# Patient Record
Sex: Female | Born: 1990 | Race: Black or African American | Hispanic: No | Marital: Single | Smoking: Never smoker
Health system: Southern US, Community
[De-identification: ages and names within clinical notes are randomized; demographics above are authoritative.]

## PROBLEM LIST (undated history)

## (undated) ENCOUNTER — Inpatient Hospital Stay (HOSPITAL_COMMUNITY): Payer: Self-pay

## (undated) DIAGNOSIS — N61 Mastitis without abscess: Secondary | ICD-10-CM

## (undated) HISTORY — PX: HERNIA REPAIR: SHX51

---
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
## 2013-10-10 NOTE — L&D Delivery Note (Signed)
Delivery Note At 8:53 PM a viable female was delivered via Vaginal, Spontaneous Delivery (Presentation:  OA ;  ).  APGAR: 8, 9; weight:  2750 grams .   Placenta status: Intact, Spontaneous.  Cord: 3 vessels with the following complications: Short.  Cord pH: none  Anesthesia: Epidural  Episiotomy: None Lacerations: None Suture Repair: None Est. Blood Loss (mL): 350  Mom to postpartum.  Baby to Couplet care / Skin to Skin.  [name] 03/15/2014, 9:48 PM

## 2013-11-28 ENCOUNTER — Ambulatory Visit (INDEPENDENT_AMBULATORY_CARE_PROVIDER_SITE_OTHER): Admitting: Obstetrics & Gynecology

## 2013-11-28 ENCOUNTER — Encounter: Payer: Self-pay | Admitting: Obstetrics & Gynecology

## 2013-11-28 ENCOUNTER — Other Ambulatory Visit: Payer: Self-pay | Admitting: *Deleted

## 2013-11-28 VITALS — BP 126/80 | Temp 97.9°F | Ht 60.0 in | Wt 122.0 lb

## 2013-11-28 DIAGNOSIS — Z3201 Encounter for pregnancy test, result positive: Secondary | ICD-10-CM

## 2013-11-28 DIAGNOSIS — Z3400 Encounter for supervision of normal first pregnancy, unspecified trimester: Secondary | ICD-10-CM | POA: Insufficient documentation

## 2013-11-28 NOTE — Progress Notes (Signed)
Pulse -91  Subjective:    Carol Williamson is being seen today for her first obstetrical visit.  This is not a planned pregnancy. She is at 4919w0d gestation. Her obstetrical history is significant for late to prenatal care. Relationship with FOB: not together and not involved. Patient does not intend to breast feed. Pregnancy history fully reviewed.  Menstrual History: OB History   Grav Para Term Preterm Abortions TAB SAB Ect Mult Living   1               Last Pap: 04/2013 WNL Menarche age: [age]  Patient's last menstrual period was 06/06/2013.    The following portions of the patient's history were reviewed and updated as appropriate: allergies, current medications, past family history, past medical history, past social history, past surgical history and problem list.  Review of Systems Pertinent items are noted in HPI.    Objective:      General Appearance:    Alert, cooperative, no distress, appears stated age  Head:    Normocephalic, without obvious abnormality, atraumatic  Eyes:    PERRL, conjunctiva/corneas clear, EOM's intact, fundi    benign, both eyes  Ears:    Normal TM's and external ear canals, both ears  Nose:   Nares normal, septum midline, mucosa normal, no drainage    or sinus tenderness  Throat:   Lips, mucosa, and tongue normal; teeth and gums normal  Neck:   Supple, symmetrical, trachea midline, no adenopathy;    thyroid:  no enlargement/tenderness/nodules; no carotid   bruit or JVD  Back:     Symmetric, no curvature, ROM normal, no CVA tenderness  Lungs:     Clear to auscultation bilaterally, respirations unlabored  Chest Wall:    No tenderness or deformity   Heart:    Regular rate and rhythm, S1 and S2 normal, no murmur, rub   or gallop  Breast Exam:    No tenderness, masses, or nipple abnormality  Abdomen:     Soft, non-tender, bowel sounds active all four quadrants,    no masses, no organomegaly  Genitalia:    Normal female without lesion, discharge or  tenderness  Extremities:   Extremities normal, atraumatic, no cyanosis or edema  Pulses:   2+ and symmetric all extremities  Skin:   Skin color, texture, turgor normal, no rashes or lesions  Lymph nodes:   Cervical, supraclavicular, and axillary nodes normal  Neurologic:   CNII-XII intact, normal strength, sensation and reflexes    throughout        Assessment:    Pregnancy at 5619w0d weeks    Plan:    Initial labs drawn. Prenatal vitamins.  Counseling provided regarding continued use of seat belts, cessation of alcohol consumption, smoking or use of illicit drugs; infection precautions i.e., influenza/TDAP immunizations, toxoplasmosis,CMV, parvovirus, listeria and varicella; workplace safety, exercise during pregnancy; routine dental care, safe medications, sexual activity, hot tubs, saunas, pools, travel, caffeine use, fish and methlymercury, potential toxins, hair treatments, varicose veins Weight gain recommendations per IOM guidelines reviewed: normal weight/BMI 18.5 - 24.9--> gain 25 - 35 lbs Problem list reviewed and updated. Role of ultrasound in pregnancy discussed; fetal survey: ordered. Amniocentesis discussed: not indicated. Follow up in 4 weeks. 50% of 20 min visit spent on counseling and coordination of care.

## 2013-11-29 LAB — OBSTETRIC PANEL
Antibody Screen: NEGATIVE
Basophils Absolute: 0 10*3/uL (ref 0.0–0.1)
Basophils Relative: 0 % (ref 0–1)
Eosinophils Absolute: 0.1 10*3/uL (ref 0.0–0.7)
Eosinophils Relative: 1 % (ref 0–5)
HCT: 31.7 % — ABNORMAL LOW (ref 36.0–46.0)
HEP B S AG: NEGATIVE
Hemoglobin: 10.8 g/dL — ABNORMAL LOW (ref 12.0–15.0)
LYMPHS PCT: 18 % (ref 12–46)
Lymphs Abs: 1.8 10*3/uL (ref 0.7–4.0)
MCH: 30 pg (ref 26.0–34.0)
MCHC: 34.1 g/dL (ref 30.0–36.0)
MCV: 88.1 fL (ref 78.0–100.0)
MONOS PCT: 5 % (ref 3–12)
Monocytes Absolute: 0.5 10*3/uL (ref 0.1–1.0)
NEUTROS ABS: 7.7 10*3/uL (ref 1.7–7.7)
Neutrophils Relative %: 76 % (ref 43–77)
Platelets: 320 10*3/uL (ref 150–400)
RBC: 3.6 MIL/uL — AB (ref 3.87–5.11)
RDW: 13.1 % (ref 11.5–15.5)
RH TYPE: POSITIVE
RUBELLA: 8.41 {index} — AB (ref <0.90)
WBC: 10.1 10*3/uL (ref 4.0–10.5)

## 2013-11-29 LAB — HEMOGLOBINOPATHY EVALUATION
HEMOGLOBIN OTHER: 0 %
Hgb A2 Quant: 2.8 % (ref 2.2–3.2)
Hgb A: 97.2 % (ref 96.8–97.8)
Hgb F Quant: 0 % (ref 0.0–2.0)
Hgb S Quant: 0 %

## 2013-11-29 LAB — GC/CHLAMYDIA PROBE AMP
CT Probe RNA: NEGATIVE
GC Probe RNA: NEGATIVE

## 2013-11-29 LAB — VARICELLA ZOSTER ANTIBODY, IGG: VARICELLA IGG: 2798 {index} — AB (ref <135.00)

## 2013-11-29 LAB — WET PREP BY MOLECULAR PROBE
Candida species: POSITIVE — AB
Gardnerella vaginalis: POSITIVE — AB
Trichomonas vaginosis: NEGATIVE

## 2013-11-29 LAB — HIV ANTIBODY (ROUTINE TESTING W REFLEX): HIV: NONREACTIVE

## 2013-11-29 LAB — VITAMIN D 25 HYDROXY (VIT D DEFICIENCY, FRACTURES): Vit D, 25-Hydroxy: 40 ng/mL (ref 30–89)

## 2013-11-29 NOTE — Patient Instructions (Signed)
Second Trimester of Pregnancy The second trimester is from week 13 through week 28, months 4 through 6. The second trimester is often a time when you feel your best. Your body has also adjusted to being pregnant, and you begin to feel better physically. Usually, morning sickness has lessened or quit completely, you may have more energy, and you may have an increase in appetite. The second trimester is also a time when the fetus is growing rapidly. At the end of the sixth month, the fetus is about 9 inches long and weighs about 1 pounds. You will likely begin to feel the baby move (quickening) between 18 and 20 weeks of the pregnancy. BODY CHANGES Your body goes through many changes during pregnancy. The changes vary from woman to woman.   Your weight will continue to increase. You will notice your lower abdomen bulging out.  You may begin to get stretch marks on your hips, abdomen, and breasts.  You may develop headaches that can be relieved by medicines approved by your caregiver.  You may urinate more often because the fetus is pressing on your bladder.  You may develop or continue to have heartburn as a result of your pregnancy.  You may develop constipation because certain hormones are causing the muscles that push waste through your intestines to slow down.  You may develop hemorrhoids or swollen, bulging veins (varicose veins).  You may have back pain because of the weight gain and pregnancy hormones relaxing your joints between the bones in your pelvis and as a result of a shift in weight and the muscles that support your balance.  Your breasts will continue to grow and be tender.  Your gums may bleed and may be sensitive to brushing and flossing.  Dark spots or blotches (chloasma, mask of pregnancy) may develop on your face. This will likely fade after the baby is born.  A dark line from your belly button to the pubic area (linea nigra) may appear. This will likely fade after the  baby is born. WHAT TO EXPECT AT YOUR PRENATAL VISITS During a routine prenatal visit:  You will be weighed to make sure you and the fetus are growing normally.  Your blood pressure will be taken.  Your abdomen will be measured to track your baby's growth.  The fetal heartbeat will be listened to.  Any test results from the previous visit will be discussed. Your caregiver may ask you:  How you are feeling.  If you are feeling the baby move.  If you have had any abnormal symptoms, such as leaking fluid, bleeding, severe headaches, or abdominal cramping.  If you have any questions. Other tests that may be performed during your second trimester include:  Blood tests that check for:  Low iron levels (anemia).  Gestational diabetes (between 24 and 28 weeks).  Rh antibodies.  Urine tests to check for infections, diabetes, or protein in the urine.  An ultrasound to confirm the proper growth and development of the baby.  An amniocentesis to check for possible genetic problems.  Fetal screens for spina bifida and Down syndrome. HOME CARE INSTRUCTIONS   Avoid all smoking, herbs, alcohol, and unprescribed drugs. These chemicals affect the formation and growth of the baby.  Follow your caregiver's instructions regarding medicine use. There are medicines that are either safe or unsafe to take during pregnancy.  Exercise only as directed by your caregiver. Experiencing uterine cramps is a good sign to stop exercising.  Continue to eat regular,   healthy meals.  Wear a good support bra for breast tenderness.  Do not use hot tubs, steam rooms, or saunas.  Wear your seat belt at all times when driving.  Avoid raw meat, uncooked cheese, cat litter boxes, and soil used by cats. These carry germs that can cause birth defects in the baby.  Take your prenatal vitamins.  Try taking a stool softener (if your caregiver approves) if you develop constipation. Eat more high-fiber foods,  such as fresh vegetables or fruit and whole grains. Drink plenty of fluids to keep your urine clear or pale yellow.  Take warm sitz baths to soothe any pain or discomfort caused by hemorrhoids. Use hemorrhoid cream if your caregiver approves.  If you develop varicose veins, wear support hose. Elevate your feet for 15 minutes, 3 4 times a day. Limit salt in your diet.  Avoid heavy lifting, wear low heel shoes, and practice good posture.  Rest with your legs elevated if you have leg cramps or low back pain.  Visit your dentist if you have not gone yet during your pregnancy. Use a soft toothbrush to brush your teeth and be gentle when you floss.  A sexual relationship may be continued unless your caregiver directs you otherwise.  Continue to go to all your prenatal visits as directed by your caregiver. SEEK MEDICAL CARE IF:   You have dizziness.  You have mild pelvic cramps, pelvic pressure, or nagging pain in the abdominal area.  You have persistent nausea, vomiting, or diarrhea.  You have a bad smelling vaginal discharge.  You have pain with urination. SEEK IMMEDIATE MEDICAL CARE IF:   You have a fever.  You are leaking fluid from your vagina.  You have spotting or bleeding from your vagina.  You have severe abdominal cramping or pain.  You have rapid weight gain or loss.  You have shortness of breath with chest pain.  You notice sudden or extreme swelling of your face, hands, ankles, feet, or legs.  You have not felt your baby move in over an hour.  You have severe headaches that do not go away with medicine.  You have vision changes. Document Released: 09/20/2001 Document Revised: 05/29/2013 Document Reviewed: 11/27/2012 [employer] Patient Information 2014 [employer].  

## 2013-11-30 LAB — CULTURE, OB URINE
COLONY COUNT: NO GROWTH
Organism ID, Bacteria: NO GROWTH

## 2013-12-02 ENCOUNTER — Ambulatory Visit (HOSPITAL_COMMUNITY)

## 2013-12-02 ENCOUNTER — Ambulatory Visit (HOSPITAL_COMMUNITY)
Admission: RE | Admit: 2013-12-02 | Discharge: 2013-12-02 | Disposition: A | Discharge status: Home / Self Care | Source: Ambulatory Visit | Attending: Obstetrics & Gynecology | Admitting: Obstetrics & Gynecology

## 2013-12-02 DIAGNOSIS — Z3400 Encounter for supervision of normal first pregnancy, unspecified trimester: Secondary | ICD-10-CM

## 2013-12-02 DIAGNOSIS — O0930 Supervision of pregnancy with insufficient antenatal care, unspecified trimester: Secondary | ICD-10-CM | POA: Insufficient documentation

## 2013-12-02 DIAGNOSIS — Z3689 Encounter for other specified antenatal screening: Secondary | ICD-10-CM | POA: Insufficient documentation

## 2013-12-02 IMAGING — US US OB COMP +14 WK
2 series · 12 of 28 positions shown · non-contrast
Comparison: none

[Series 1: us ob comp +14 wk · 11 of 78 slices shown (1 of 2)]
[im 4/78]
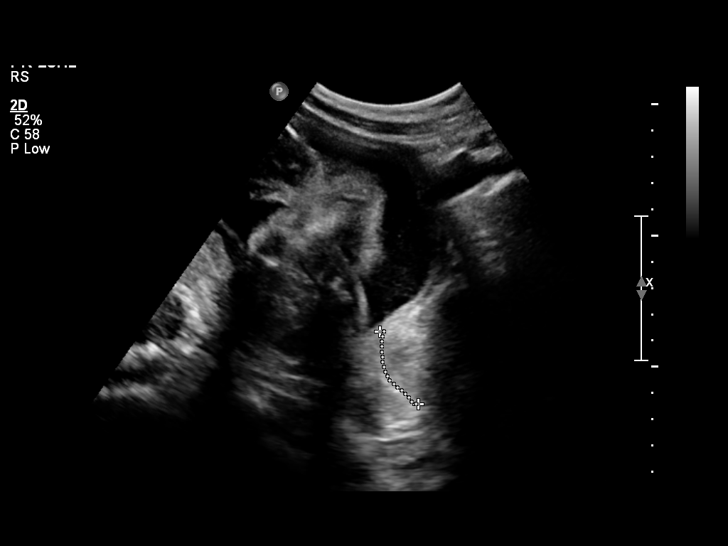
[im 10/78]
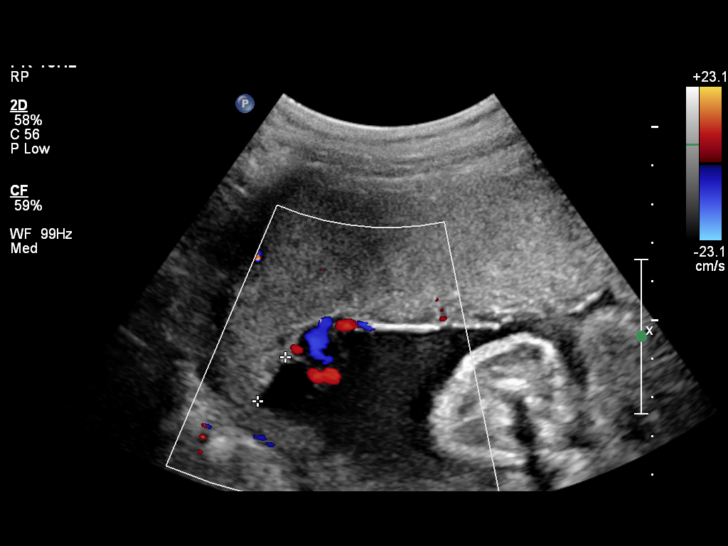
[im 16/78]
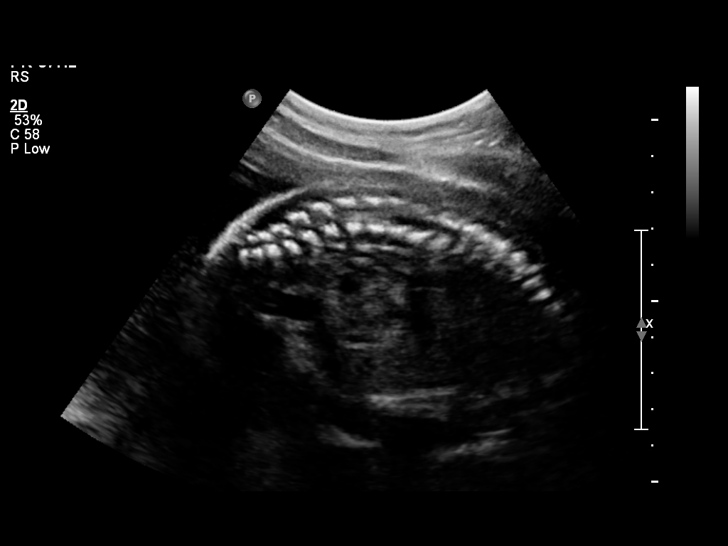
[im 25/78]
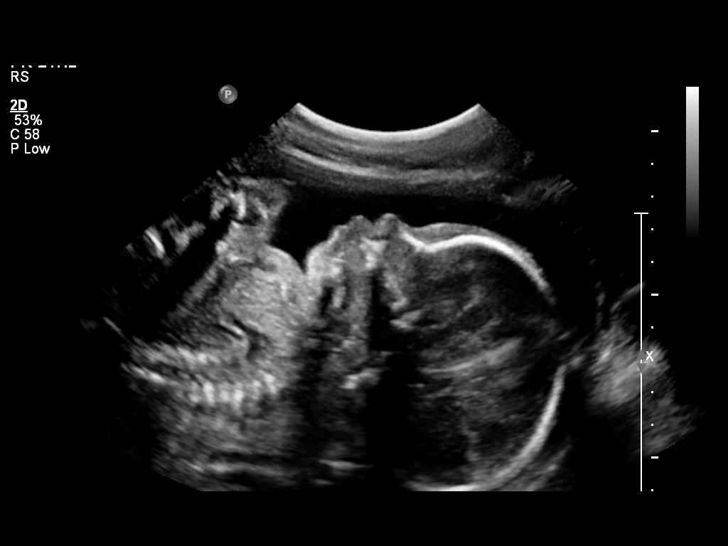
[im 31/78]
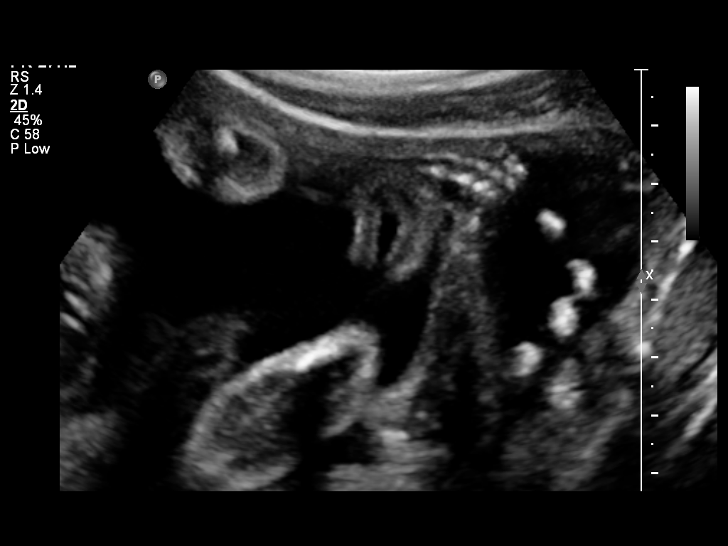
[im 37/78]
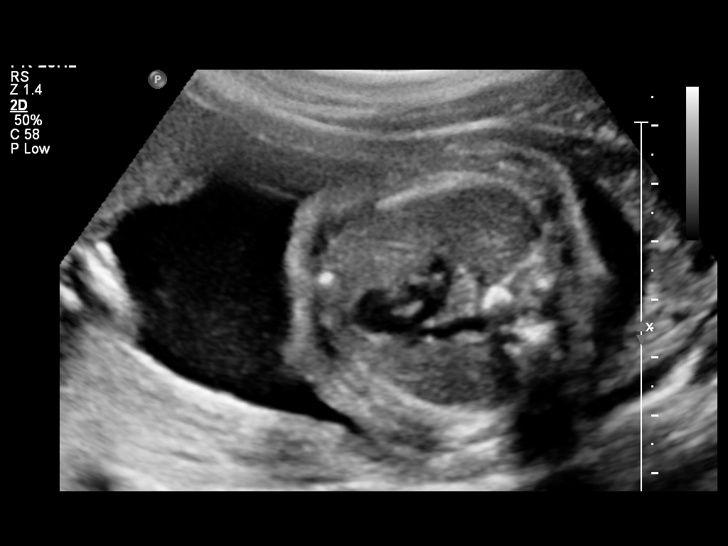
[im 47/78]
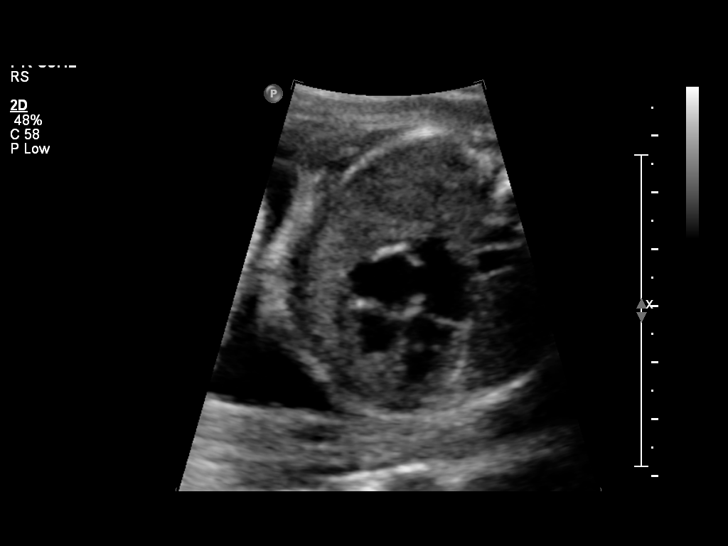
[im 53/78]
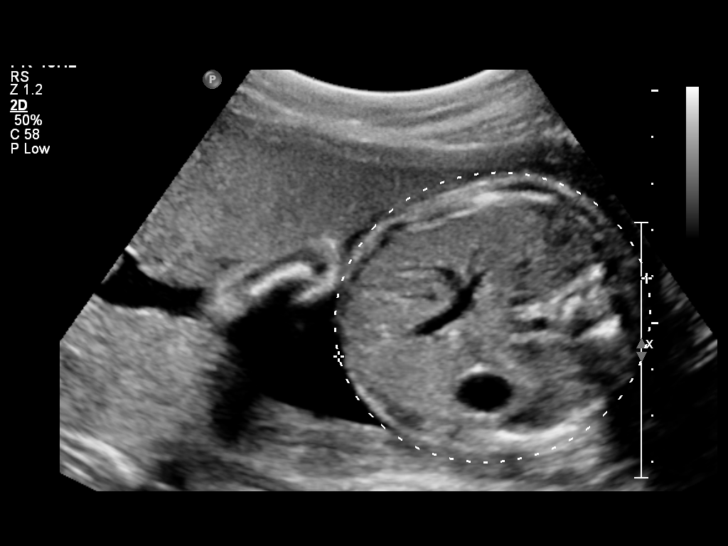
[im 59/78]
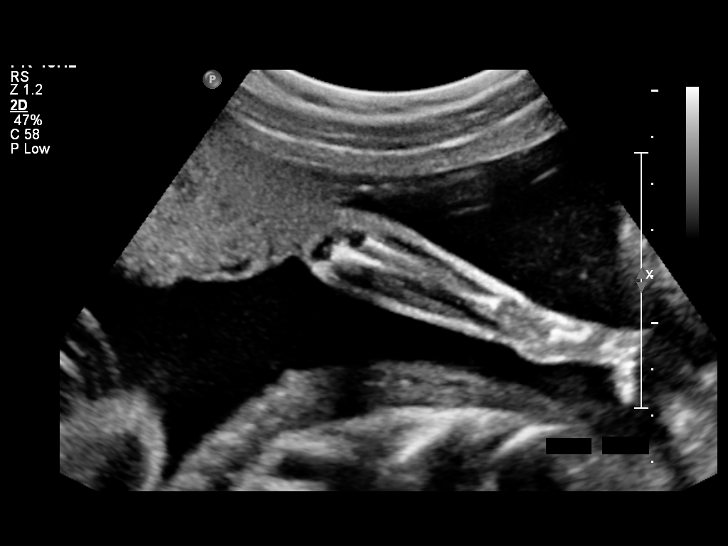
[im 68/78]
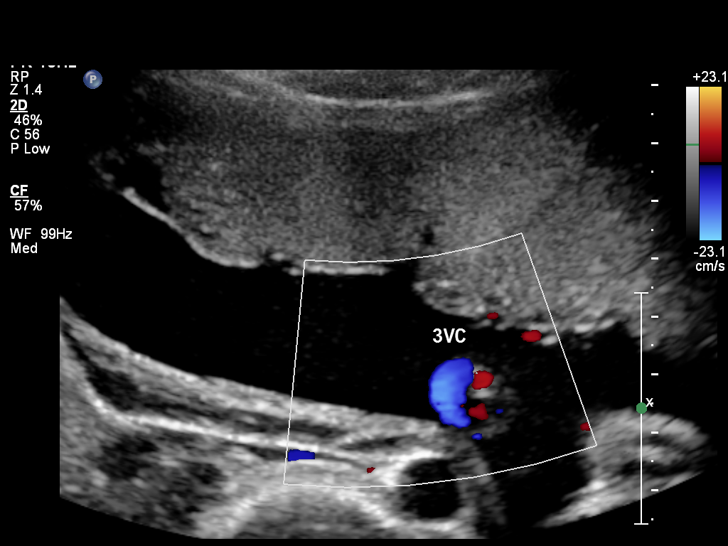
[im 74/78]
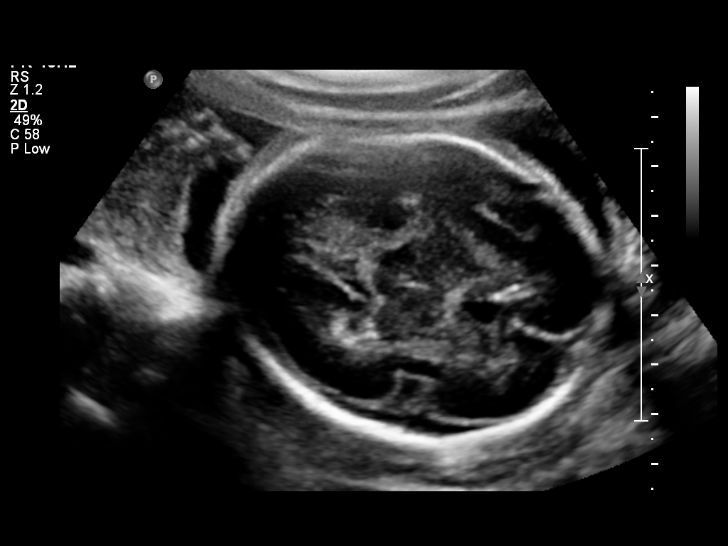

[Series 1: us ob comp +14 wk · 1 of 6 slices shown (2 of 2)]
[im 1/6]
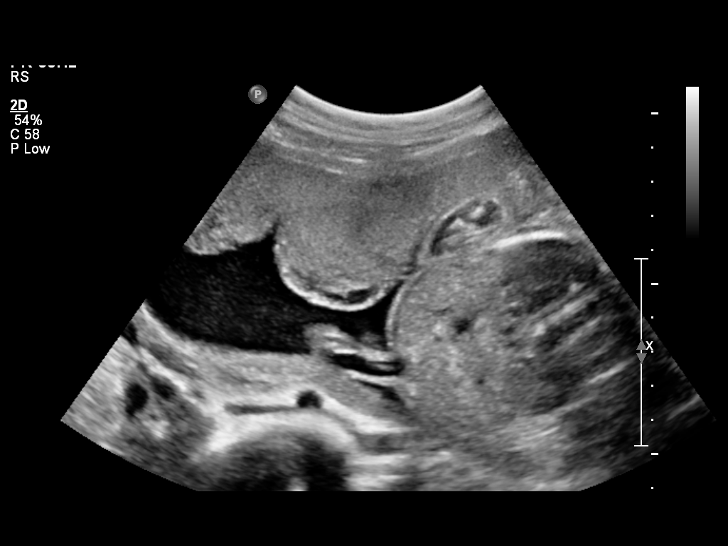

[12 of 28 positions shown; findings below may reference images not displayed]

OBSTETRICS REPORT
                      (Signed Final [DATE] [DATE])

Service(s) Provided

 US OB COMP + 14 WK                                    76805.1
Indications

 No or Little Prenatal Care                            [AF]
 Uncertain LMP;  Establish Gestational [AGE]
Fetal Evaluation

 Num Of Fetuses:    1
 Fetal Heart Rate:  158                          bpm
 Cardiac Activity:  Observed
 Presentation:      Cephalic
 Placenta:          Anterior, above cervical os
 P. Cord            Visualized, central
 Insertion:

 Amniotic Fluid
 AFI FV:      Subjectively within normal limits
                                             Larg Pckt:       5  cm
Biometry

 BPD:     61.6  mm     G. Age:  25w 0d                CI:        72.13   70 - 86
                                                      FL/HC:      19.3   18.7 -

 HC:     230.8  mm     G. Age:  25w 1d       33  %    HC/AC:      1.12   1.04 -

 AC:     205.8  mm     G. Age:  25w 1d       46  %    FL/BPD:     72.2   71 - 87
 FL:      44.5  mm     G. Age:  24w 5d       26  %    FL/AC:      21.6   20 - 24
 HUM:     41.6  mm     G. Age:  25w 1d       46  %
 Est. FW:     755  gm    1 lb 11 oz      53  %
Gestational Age

 LMP:           25w 4d        Date:  [DATE]                 EDD:   [DATE]
 U/S Today:     25w 0d                                        EDD:   [DATE]
 Best:          25w 0d     Det. By:  U/S ([DATE])           EDD:   [DATE]
Anatomy

 Cranium:          Appears normal         Aortic Arch:      Appears normal
 Fetal Cavum:      Appears normal         Ductal Arch:      Appears normal
 Ventricles:       Appears normal         Diaphragm:        Appears normal
 Choroid Plexus:   Appears normal         Stomach:          Appears normal, left
                                                            sided
 Cerebellum:       Appears normal         Abdomen:          Appears normal
 Posterior Fossa:  Appears normal         Abdominal Wall:   Appears nml (cord
                                                            insert, abd wall)
 Nuchal Fold:      Not applicable (>20    Cord Vessels:     Appears normal (3
                   wks GA)                                  vessel cord)
 Face:             Appears normal         Kidneys:          Appear normal
                   (orbits and profile)
 Lips:             Appears normal         Bladder:          Appears normal
 Heart:            Appears normal         Spine:            Appears normal
                   (4CH, axis, and
                   situs)
 RVOT:             Appears normal         Lower             Appears normal
                                          Extremities:
 LVOT:             Appears normal         Upper             Appears normal
                                          Extremities:

 Other:  Female gender. Heels and  5th digit visualized.
Cervix Uterus Adnexa

 Cervical Length:    3.5      cm

 Cervix:       Normal appearance by transabdominal scan.
 Uterus:       No abnormality visualized.
 Left Ovary:    No adnexal mass visualized.
 Right Ovary:   No adnexal mass visualized.

 Adnexa:     No abnormality visualized.
Impression

 IUP at 25+0 weeks
 Normal detailed fetal anatomy
 Normal amniotic fluid volume
 EDC based on today's measurements; EDC [DATE]
Recommendations

 Follow-up as clinically indicated

 questions or concerns.

## 2013-12-06 ENCOUNTER — Other Ambulatory Visit: Payer: Self-pay | Admitting: *Deleted

## 2013-12-06 DIAGNOSIS — N760 Acute vaginitis: Secondary | ICD-10-CM

## 2013-12-06 DIAGNOSIS — B9689 Other specified bacterial agents as the cause of diseases classified elsewhere: Secondary | ICD-10-CM

## 2013-12-06 DIAGNOSIS — B379 Candidiasis, unspecified: Secondary | ICD-10-CM

## 2013-12-06 MED ORDER — TERCONAZOLE 0.4 % VA CREA
1.0000 | TOPICAL_CREAM | Freq: Every day | VAGINAL | Status: DC
Start: 2013-12-06 — End: 2013-12-30

## 2013-12-06 MED ORDER — METRONIDAZOLE 500 MG PO TABS: 500.0000 mg | ORAL_TABLET | Freq: Two times a day (BID) | ORAL | Status: DC

## 2013-12-24 ENCOUNTER — Other Ambulatory Visit

## 2013-12-24 DIAGNOSIS — Z3400 Encounter for supervision of normal first pregnancy, unspecified trimester: Secondary | ICD-10-CM

## 2013-12-24 LAB — CBC
HCT: 32.2 % — ABNORMAL LOW (ref 36.0–46.0)
Hemoglobin: 10.9 g/dL — ABNORMAL LOW (ref 12.0–15.0)
MCH: 29.9 pg (ref 26.0–34.0)
MCHC: 33.9 g/dL (ref 30.0–36.0)
MCV: 88.5 fL (ref 78.0–100.0)
PLATELETS: 263 10*3/uL (ref 150–400)
RBC: 3.64 MIL/uL — AB (ref 3.87–5.11)
RDW: 12.9 % (ref 11.5–15.5)
WBC: 12.3 10*3/uL — ABNORMAL HIGH (ref 4.0–10.5)

## 2013-12-24 LAB — OB RESULTS CONSOLE HIV ANTIBODY (ROUTINE TESTING): HIV: NONREACTIVE

## 2013-12-25 LAB — GLUCOSE TOLERANCE, 2 HOURS W/ 1HR
GLUCOSE, FASTING: 61 mg/dL — AB (ref 70–99)
Glucose, 1 hour: 78 mg/dL (ref 70–170)
Glucose, 2 hour: 77 mg/dL (ref 70–139)

## 2013-12-25 LAB — HIV ANTIBODY (ROUTINE TESTING W REFLEX): HIV: NONREACTIVE

## 2013-12-25 LAB — RPR

## 2013-12-26 ENCOUNTER — Other Ambulatory Visit

## 2013-12-30 ENCOUNTER — Ambulatory Visit (INDEPENDENT_AMBULATORY_CARE_PROVIDER_SITE_OTHER): Admitting: Obstetrics & Gynecology

## 2013-12-30 ENCOUNTER — Encounter: Admitting: Obstetrics & Gynecology

## 2013-12-30 VITALS — BP 111/68 | Temp 98.7°F | Wt 122.0 lb

## 2013-12-30 DIAGNOSIS — Z3400 Encounter for supervision of normal first pregnancy, unspecified trimester: Secondary | ICD-10-CM

## 2013-12-30 NOTE — Progress Notes (Signed)
Pulse: 90 Patient states she has some pain in the middle of her stomach sometimes. Patient states her knees have been giving out a lot.

## 2013-12-31 NOTE — Patient Instructions (Signed)

## 2014-01-15 ENCOUNTER — Encounter: Payer: Self-pay | Admitting: Obstetrics

## 2014-01-15 ENCOUNTER — Ambulatory Visit (INDEPENDENT_AMBULATORY_CARE_PROVIDER_SITE_OTHER): Admitting: Obstetrics

## 2014-01-15 VITALS — BP 108/74 | Temp 98.5°F | Wt 126.0 lb

## 2014-01-15 DIAGNOSIS — Z3400 Encounter for supervision of normal first pregnancy, unspecified trimester: Secondary | ICD-10-CM

## 2014-01-15 DIAGNOSIS — Z23 Encounter for immunization: Secondary | ICD-10-CM

## 2014-01-15 LAB — POCT URINALYSIS DIPSTICK
Bilirubin, UA: NEGATIVE
Glucose, UA: NEGATIVE
KETONES UA: NEGATIVE
Leukocytes, UA: NEGATIVE
Nitrite, UA: NEGATIVE
PROTEIN UA: NEGATIVE
RBC UA: NEGATIVE
SPEC GRAV UA: 1.01
Urobilinogen, UA: NEGATIVE
pH, UA: 7

## 2014-01-15 MED ORDER — DIPHTH-ACELL PERTUSSIS-TETANUS 6.7-46.8-5 LF-MCG/0.5 IM SUSP: 0.5000 mL | Freq: Once | INTRAMUSCULAR | Status: DC

## 2014-01-15 NOTE — Progress Notes (Signed)
Pulse: 91 Patient states she would like the Tdap.

## 2014-01-27 ENCOUNTER — Encounter: Payer: Self-pay | Admitting: Obstetrics

## 2014-01-30 ENCOUNTER — Ambulatory Visit (INDEPENDENT_AMBULATORY_CARE_PROVIDER_SITE_OTHER): Admitting: Obstetrics & Gynecology

## 2014-01-30 ENCOUNTER — Encounter: Admitting: Obstetrics

## 2014-01-30 VITALS — BP 126/80 | HR 89 | Temp 98.2°F | Wt 129.0 lb

## 2014-01-30 DIAGNOSIS — Z3400 Encounter for supervision of normal first pregnancy, unspecified trimester: Secondary | ICD-10-CM

## 2014-01-30 LAB — POCT URINALYSIS DIPSTICK
BILIRUBIN UA: NEGATIVE
Blood, UA: NEGATIVE
Glucose, UA: NEGATIVE
KETONES UA: NEGATIVE
Nitrite, UA: NEGATIVE
Protein, UA: NEGATIVE
Spec Grav, UA: <=1.005
Urobilinogen, UA: NEGATIVE
pH, UA: 8

## 2014-01-30 NOTE — Progress Notes (Signed)
Subjective:    Carol Williamson is a 23 y.o. female being seen today for her obstetrical visit. She is at 4147w6d gestation. Patient reports no complaints. Fetal movement: normal.   Past Medical History  Diagnosis Date  . Medical history non-contributory     Past Surgical History  Procedure Laterality Date  . Hernia repair      Current outpatient prescriptions:Pediatric Multiple Vit-C-FA (FLINSTONES GUMMIES OMEGA-3 DHA PO), Take by mouth., Disp: , Rfl:  Current facility-administered medications:DTaP (TRIPEDIA) injection 0.5 mL, 0.5 mL, Intramuscular, Once, Brock Badharles A Harper, MD No Known Allergies  History  Substance Use Topics  . Smoking status: Never Smoker   . Smokeless tobacco: Never Used  . Alcohol Use: No    Family History  Problem Relation Age of Onset  . Hypertension Father   . Diabetes Father      Review of Systems Constitutional: negative for anorexia Gastrointestinal: negative for abdominal pain Genitourinary:negative for vaginal discharge Musculoskeletal:negative for back pain Behavioral/Psych: negative for depression and tobacco use   Objective:    BP 126/80  Pulse 89  Temp(Src) 98.2 F (36.8 C)  Wt 58.514 kg (129 lb)  LMP 06/06/2013 FHT:  130 BPM  Uterine Size: size equals dates  Presentation: cephalic     Assessment:    Pregnancy @ 6947w6d weeks   Plan:     labs reviewed, problem list updated  Orders Placed This Encounter  Procedures  . POCT urinalysis dipstick    Follow up in 1 Week.

## 2014-02-12 ENCOUNTER — Encounter: Payer: Self-pay | Admitting: Obstetrics & Gynecology

## 2014-02-12 ENCOUNTER — Ambulatory Visit (INDEPENDENT_AMBULATORY_CARE_PROVIDER_SITE_OTHER): Admitting: Obstetrics & Gynecology

## 2014-02-12 VITALS — BP 136/89 | HR 84 | Temp 98.0°F | Wt 130.0 lb

## 2014-02-12 DIAGNOSIS — Z3400 Encounter for supervision of normal first pregnancy, unspecified trimester: Secondary | ICD-10-CM

## 2014-02-12 LAB — OB RESULTS CONSOLE GC/CHLAMYDIA
Chlamydia: NEGATIVE
Gonorrhea: NEGATIVE

## 2014-02-12 NOTE — Progress Notes (Signed)
Subjective:    Carol Williamson is a 23 y.o. female being seen today for her obstetrical visit. She is at 846w6d  Patient reports no complaints. Fetal movement: normal.   Past Medical History  Diagnosis Date  . Medical history non-contributory     Past Surgical History  Procedure Laterality Date  . Hernia repair      Current outpatient prescriptions:Pediatric Multiple Vit-C-FA (FLINSTONES GUMMIES OMEGA-3 DHA PO), Take by mouth., Disp: , Rfl:  Current facility-administered medications:DTaP (TRIPEDIA) injection 0.5 mL, 0.5 mL, Intramuscular, Once, Brock Badharles A Harper, MD No Known Allergies  History  Substance Use Topics  . Smoking status: Never Smoker   . Smokeless tobacco: Never Used  . Alcohol Use: No    Family History  Problem Relation Age of Onset  . Hypertension Father   . Diabetes Father      Review of Systems Constitutional: negative for anorexia Gastrointestinal: negative for abdominal pain Genitourinary:negative for vaginal discharge Musculoskeletal:negative for back pain Behavioral/Psych: negative for depression and tobacco use   Objective:    BP 136/89  Pulse 84  Temp(Src) 98 F (36.7 C)  Wt 58.968 kg (130 lb)  LMP 06/06/2013 FHT:  140 BPM  Uterine Size: size equals dates  Presentation: cephalic     Assessment:    Pregnancy @ 2746w6d  weeks   Plan:     labs reviewed, problem list updated  Orders Placed This Encounter  Procedures  . Strep B DNA probe  . GC/Chlamydia Probe Amp  . POCT urinalysis dipstick    Follow up in 1 Week.

## 2014-02-12 NOTE — Patient Instructions (Signed)
Patient information: Group B streptococcus and pregnancy (Beyond the Basics)  Authors Karen M Puopolo, MD, PhD Carol J Baker, MD Section Editors Charles J Lockwood, MD Daniel J Sexton, MD Deputy Editor Vanessa A Barss, MD Disclosures  All topics are updated as new evidence becomes available and our peer review process is complete.  Literature review current through: Feb 2014.  This topic last updated: Apr 09, 2012.  INTRODUCTION - Group B streptococcus (GBS) is a bacterium that can cause serious infections in pregnant women and newborn babies. GBS is one of many types of streptococcal bacteria, sometimes called "strep." This article discusses GBS, its effect on pregnant women and infants, and ways to prevent complications of GBS. More detailed information about GBS is available by subscription. (See "Group B streptococcal infection in pregnant women".) WHAT IS GROUP B STREP INFECTION? - GBS is commonly found in the digestive system and the vagina. In healthy adults, GBS is not harmful and does not cause problems. But in pregnant women and newborn infants, being infected with GBS can cause serious illness. Approximately one in three to four pregnant women in the [place] carries GBS in their gastrointestinal system and/or in their vagina. Carrying GBS is not the same as being infected. Carriers are not sick and do not need treatment during pregnancy. There is no treatment that can stop you from carrying GBS.  Pregnant women who are carriers of GBS infrequently become infected with GBS. GBS can cause urinary tract infections, infection of the amniotic fluid (bag of water), and infection of the uterus after delivery. GBS infections during pregnancy may lead to preterm labor.  Pregnant women who carry GBS can pass on the bacteria to their newborns, and some of those babies become infected with GBS. Newborns who are infected with GBS can develop pneumonia (lung infection), septicemia (blood infection), or  meningitis (infection of the lining of the brain and spinal cord). These complications can be prevented by giving intravenous antibiotics during labor to any woman who is at risk of GBS infection. You are at risk of GBS infection if: You have a urine culture during your current pregnancy showing GBS  You have a vaginal and rectal culture during your current pregnancy showing GBS  You had an infant infected with GBS in the past GROUP B STREP PREVENTION - Most doctors and nurses recommend a urine culture early in your pregnancy to be sure that you do not have a bladder infection without symptoms. If you urine culture shows GBS or other bacteria, you may be treated with an antibiotic. If you have symptoms of urinary infection, such as pain with urination, any time during your pregnancy, a urine culture is done. If GBS grows from the urine culture, it should be treated with an antibiotic, and you should also receive intravenous antibiotics during labor. Expert groups recommend that all pregnant women have a GBS culture at 35 to 37 weeks of pregnancy. The culture is done by swabbing the vagina and rectum. If your GBS culture is positive, you will be given an intravenous antibiotic during labor. If you have preterm labor, the culture is done then and an intravenous antibiotic is given until the baby is born or the labor is stopped by your health care provider. If you have a positive GBS culture and you have an allergy to penicillin, be sure your doctor and nurse are aware of this allergy and tell them what happened with the allergy. If you had only a rash or itching, this   is not a serious allergy, and you can receive a common drug related to the penicillin. If you had a serious allergy (for example, trouble breathing, swelling of your face) you may need an additional test to determine which antibiotic should be used during labor. Being treated with an antibiotic during labor greatly reduces the chance that you or  your newborn will develop infections related to GBS. It is important to note that young infants up to age 3 months can also develop septicemia, meningitis and other serious infections from GBS. Being treated with an antibiotic during labor does not reduce the chance that your baby will develop this later type of infection. There is currently no known way of preventing this later-onset GBS disease. WHERE TO GET MORE INFORMATION - Your healthcare provider is the best source of information for questions and concerns related to your medical problem.  

## 2014-02-13 ENCOUNTER — Encounter: Admitting: Obstetrics & Gynecology

## 2014-02-13 LAB — POCT URINALYSIS DIPSTICK
Blood, UA: NEGATIVE
GLUCOSE UA: NEGATIVE
Ketones, UA: NEGATIVE
LEUKOCYTES UA: NEGATIVE
NITRITE UA: NEGATIVE
Spec Grav, UA: <=1.005
pH, UA: 7

## 2014-02-13 LAB — GC/CHLAMYDIA PROBE AMP
CT Probe RNA: NEGATIVE
GC PROBE AMP APTIMA: NEGATIVE

## 2014-02-14 LAB — STREP B DNA PROBE: GBSP: POSITIVE

## 2014-02-16 ENCOUNTER — Encounter: Payer: Self-pay | Admitting: Obstetrics & Gynecology

## 2014-02-16 DIAGNOSIS — O99820 Streptococcus B carrier state complicating pregnancy: Secondary | ICD-10-CM | POA: Insufficient documentation

## 2014-02-19 ENCOUNTER — Ambulatory Visit (INDEPENDENT_AMBULATORY_CARE_PROVIDER_SITE_OTHER): Admitting: Obstetrics & Gynecology

## 2014-02-19 VITALS — BP 120/80 | HR 81 | Temp 99.1°F | Wt 131.0 lb

## 2014-02-19 DIAGNOSIS — Z3400 Encounter for supervision of normal first pregnancy, unspecified trimester: Secondary | ICD-10-CM

## 2014-02-19 LAB — POCT URINALYSIS DIPSTICK
Blood, UA: NEGATIVE
Glucose, UA: NEGATIVE
Ketones, UA: NEGATIVE
NITRITE UA: NEGATIVE
Protein, UA: NEGATIVE
Spec Grav, UA: 1.015
pH, UA: 7

## 2014-02-19 NOTE — Progress Notes (Signed)
Subjective:    Carol Williamson is a 23 y.o. female being seen today for her obstetrical visit. She is at 3972w6d  Patient reports no complaints. Fetal movement: normal.   Past Medical History  Diagnosis Date  . Medical history non-contributory     Past Surgical History  Procedure Laterality Date  . Hernia repair      Current outpatient prescriptions:Pediatric Multiple Vit-C-FA (FLINSTONES GUMMIES OMEGA-3 DHA PO), Take by mouth., Disp: , Rfl:  Current facility-administered medications:DTaP (TRIPEDIA) injection 0.5 mL, 0.5 mL, Intramuscular, Once, Brock Badharles A Harper, MD No Known Allergies  History  Substance Use Topics  . Smoking status: Never Smoker   . Smokeless tobacco: Never Used  . Alcohol Use: No    Family History  Problem Relation Age of Onset  . Hypertension Father   . Diabetes Father      Review of Systems Constitutional: negative for anorexia Gastrointestinal: negative for abdominal pain Genitourinary:negative for vaginal discharge Musculoskeletal:negative for back pain Behavioral/Psych: negative for depression and tobacco use   Objective:    BP 120/80  Pulse 81  Temp(Src) 99.1 F (37.3 C)  Wt 59.421 kg (131 lb)  LMP 06/06/2013 FHT:  130 BPM  Uterine Size: size equals dates  Presentation: cephalic     Assessment:    Pregnancy @ 5672w6d  weeks   Plan:     labs reviewed, problem list updated  Orders Placed This Encounter  Procedures  . POCT urinalysis dipstick    Follow up in 1 Week.

## 2014-02-26 ENCOUNTER — Ambulatory Visit (INDEPENDENT_AMBULATORY_CARE_PROVIDER_SITE_OTHER): Admitting: Obstetrics & Gynecology

## 2014-02-26 ENCOUNTER — Encounter: Admitting: Obstetrics & Gynecology

## 2014-02-26 ENCOUNTER — Encounter: Payer: Self-pay | Admitting: Obstetrics & Gynecology

## 2014-02-26 VITALS — BP 115/72 | HR 85 | Temp 98.7°F | Wt 132.0 lb

## 2014-02-26 DIAGNOSIS — Z3400 Encounter for supervision of normal first pregnancy, unspecified trimester: Secondary | ICD-10-CM

## 2014-02-26 NOTE — Progress Notes (Signed)
Subjective:    Carol Williamson is a 23 y.o. female being seen today for her obstetrical visit. She is at 560w6d   Patient reports no complaints. Fetal movement: normal.   Past Medical History  Diagnosis Date  . Medical history non-contributory     Past Surgical History  Procedure Laterality Date  . Hernia repair      Current outpatient prescriptions:Pediatric Multiple Vit-C-FA (FLINSTONES GUMMIES OMEGA-3 DHA PO), Take by mouth., Disp: , Rfl:  Current facility-administered medications:DTaP (TRIPEDIA) injection 0.5 mL, 0.5 mL, Intramuscular, Once, Brock Badharles A Harper, MD No Known Allergies  History  Substance Use Topics  . Smoking status: Never Smoker   . Smokeless tobacco: Never Used  . Alcohol Use: No    Family History  Problem Relation Age of Onset  . Hypertension Father   . Diabetes Father      Review of Systems Constitutional: negative for anorexia Gastrointestinal: negative for abdominal pain Genitourinary:negative for vaginal discharge Musculoskeletal:negative for back pain Behavioral/Psych: negative for depression and tobacco use   Objective:    BP 115/72  Pulse 85  Temp(Src) 98.7 F (37.1 C)  Wt 59.875 kg (132 lb)  LMP 06/06/2013 FHT:  130 BPM  Uterine Size: size equals dates  Presentation: cephalic     Assessment:    Pregnancy @ 4560w6d  weeks   Plan:     labs reviewed, problem list updated  Orders Placed This Encounter  Procedures  . POCT urinalysis dipstick    Follow up in 1 Week.

## 2014-03-06 ENCOUNTER — Encounter: Payer: Self-pay | Admitting: Obstetrics & Gynecology

## 2014-03-06 ENCOUNTER — Ambulatory Visit (INDEPENDENT_AMBULATORY_CARE_PROVIDER_SITE_OTHER): Admitting: Obstetrics & Gynecology

## 2014-03-06 VITALS — BP 123/87 | HR 71 | Temp 98.5°F | Wt 133.0 lb

## 2014-03-06 DIAGNOSIS — O368190 Decreased fetal movements, unspecified trimester, not applicable or unspecified: Secondary | ICD-10-CM

## 2014-03-06 DIAGNOSIS — Z3400 Encounter for supervision of normal first pregnancy, unspecified trimester: Secondary | ICD-10-CM

## 2014-03-06 LAB — POCT URINALYSIS DIPSTICK
Glucose, UA: 50
KETONES UA: NEGATIVE
Nitrite, UA: NEGATIVE
PROTEIN UA: 1
SPEC GRAV UA: 1.01
pH, UA: 7

## 2014-03-06 NOTE — Patient Instructions (Signed)
Fetal Movement Counts Patient Name: __________________________________________________ Patient Due Date: ____________________ Performing a fetal movement count is highly recommended in high-risk pregnancies, but it is good for every pregnant woman to do. Your caregiver may ask you to start counting fetal movements at 28 weeks of the pregnancy. Fetal movements often increase:  After eating a full meal.  After physical activity.  After eating or drinking something sweet or cold.  At rest. Pay attention to when you feel the baby is most active. This will help you notice a pattern of your baby's sleep and wake cycles and what factors contribute to an increase in fetal movement. It is important to perform a fetal movement count at the same time each day when your baby is normally most active.  HOW TO COUNT FETAL MOVEMENTS 1. Find a quiet and comfortable area to sit or lie down on your left side. Lying on your left side provides the best blood and oxygen circulation to your baby. 2. Write down the day and time on a sheet of paper or in a journal. 3. Start counting kicks, flutters, swishes, rolls, or jabs in a 2 hour period. You should feel at least 10 movements within 2 hours. 4. If you do not feel 10 movements in 2 hours, wait 2 3 hours and count again. Look for a change in the pattern or not enough counts in 2 hours. SEEK MEDICAL CARE IF:  You feel less than 10 counts in 2 hours, tried twice.  There is no movement in over an hour.  The pattern is changing or taking longer each day to reach 10 counts in 2 hours.  You feel the baby is not moving as he or she usually does. Date: ____________ Movements: ____________ Start time: ____________ Finish time: ____________  Date: ____________ Movements: ____________ Start time: ____________ Finish time: ____________ Date: ____________ Movements: ____________ Start time: ____________ Finish time: ____________ Date: ____________ Movements: ____________  Start time: ____________ Finish time: ____________ Date: ____________ Movements: ____________ Start time: ____________ Finish time: ____________ Date: ____________ Movements: ____________ Start time: ____________ Finish time: ____________ Date: ____________ Movements: ____________ Start time: ____________ Finish time: ____________ Date: ____________ Movements: ____________ Start time: ____________ Finish time: ____________  Date: ____________ Movements: ____________ Start time: ____________ Finish time: ____________ Date: ____________ Movements: ____________ Start time: ____________ Finish time: ____________ Date: ____________ Movements: ____________ Start time: ____________ Finish time: ____________ Date: ____________ Movements: ____________ Start time: ____________ Finish time: ____________ Date: ____________ Movements: ____________ Start time: ____________ Finish time: ____________ Date: ____________ Movements: ____________ Start time: ____________ Finish time: ____________ Date: ____________ Movements: ____________ Start time: ____________ Finish time: ____________  Date: ____________ Movements: ____________ Start time: ____________ Finish time: ____________ Date: ____________ Movements: ____________ Start time: ____________ Finish time: ____________ Date: ____________ Movements: ____________ Start time: ____________ Finish time: ____________ Date: ____________ Movements: ____________ Start time: ____________ Finish time: ____________ Date: ____________ Movements: ____________ Start time: ____________ Finish time: ____________ Date: ____________ Movements: ____________ Start time: ____________ Finish time: ____________ Date: ____________ Movements: ____________ Start time: ____________ Finish time: ____________  Date: ____________ Movements: ____________ Start time: ____________ Finish time: ____________ Date: ____________ Movements: ____________ Start time: ____________ Finish time:  ____________ Date: ____________ Movements: ____________ Start time: ____________ Finish time: ____________ Date: ____________ Movements: ____________ Start time: ____________ Finish time: ____________ Date: ____________ Movements: ____________ Start time: ____________ Finish time: ____________ Date: ____________ Movements: ____________ Start time: ____________ Finish time: ____________ Date: ____________ Movements: ____________ Start time: ____________ Finish time: ____________  Date: ____________ Movements: ____________ Start time: ____________ Finish   time: ____________ Date: ____________ Movements: ____________ Start time: ____________ Finish time: ____________ Date: ____________ Movements: ____________ Start time: ____________ Finish time: ____________ Date: ____________ Movements: ____________ Start time: ____________ Finish time: ____________ Date: ____________ Movements: ____________ Start time: ____________ Finish time: ____________ Date: ____________ Movements: ____________ Start time: ____________ Finish time: ____________ Date: ____________ Movements: ____________ Start time: ____________ Finish time: ____________  Date: ____________ Movements: ____________ Start time: ____________ Finish time: ____________ Date: ____________ Movements: ____________ Start time: ____________ Finish time: ____________ Date: ____________ Movements: ____________ Start time: ____________ Finish time: ____________ Date: ____________ Movements: ____________ Start time: ____________ Finish time: ____________ Date: ____________ Movements: ____________ Start time: ____________ Finish time: ____________ Date: ____________ Movements: ____________ Start time: ____________ Finish time: ____________ Date: ____________ Movements: ____________ Start time: ____________ Finish time: ____________  Date: ____________ Movements: ____________ Start time: ____________ Finish time: ____________ Date: ____________ Movements:  ____________ Start time: ____________ Finish time: ____________ Date: ____________ Movements: ____________ Start time: ____________ Finish time: ____________ Date: ____________ Movements: ____________ Start time: ____________ Finish time: ____________ Date: ____________ Movements: ____________ Start time: ____________ Finish time: ____________ Date: ____________ Movements: ____________ Start time: ____________ Finish time: ____________ Date: ____________ Movements: ____________ Start time: ____________ Finish time: ____________  Date: ____________ Movements: ____________ Start time: ____________ Finish time: ____________ Date: ____________ Movements: ____________ Start time: ____________ Finish time: ____________ Date: ____________ Movements: ____________ Start time: ____________ Finish time: ____________ Date: ____________ Movements: ____________ Start time: ____________ Finish time: ____________ Date: ____________ Movements: ____________ Start time: ____________ Finish time: ____________ Date: ____________ Movements: ____________ Start time: ____________ Finish time: ____________ Document Released: 10/26/2006 Document Revised: 09/12/2012 Document Reviewed: 07/23/2012 [employer] Patient Information 2014 [employer].  

## 2014-03-06 NOTE — Progress Notes (Signed)
Subjective:    Carol Williamson is a 23 y.o. female being seen today for her obstetrical visit. She is at [redacted]w[redacted]d.   Patient reports no complaints. Fetal movement: normal.   Past Medical History  Diagnosis Date  . Medical history non-contributory     Past Surgical History  Procedure Laterality Date  . Hernia repair      Current outpatient prescriptions:Pediatric Multiple Vit-C-FA (FLINSTONES GUMMIES OMEGA-3 DHA PO), Take by mouth., Disp: , Rfl:  Current facility-administered medications:DTaP (TRIPEDIA) injection 0.5 mL, 0.5 mL, Intramuscular, Once, [name], MD No Known Allergies  History  Substance Use Topics  . Smoking status: Never Smoker   . Smokeless tobacco: Never Used  . Alcohol Use: No    Family History  Problem Relation Age of Onset  . Hypertension Father   . Diabetes Father      Review of Systems Constitutional: negative for anorexia Gastrointestinal: negative for abdominal pain Genitourinary:negative for vaginal discharge Musculoskeletal:negative for back pain Behavioral/Psych: negative for depression and tobacco use   Objective:    BP 123/87  Pulse 71  Temp(Src) 98.5 F (36.9 C)  Wt 60.328 kg (133 lb)  LMP 06/06/2013 FHT:  130 BPM  Uterine Size: size equals dates  Presentation: cephalic     Assessment:    Pregnancy @ [redacted]w[redacted]d  weeks   Plan:     labs reviewed, problem list updated  Orders Placed This Encounter  Procedures  . Fetal non-stress test    Standing Status: Standing     Number of Occurrences: 1     Standing Expiration Date:   . POCT urinalysis dipstick    Follow up in 1 Week.

## 2014-03-10 LAB — POCT URINALYSIS DIPSTICK
Glucose, UA: NEGATIVE
Ketones, UA: NEGATIVE
Nitrite, UA: NEGATIVE
Protein, UA: NEGATIVE
RBC UA: NEGATIVE
Spec Grav, UA: <=1.005
pH, UA: 8

## 2014-03-13 ENCOUNTER — Ambulatory Visit (INDEPENDENT_AMBULATORY_CARE_PROVIDER_SITE_OTHER): Admitting: Obstetrics & Gynecology

## 2014-03-13 ENCOUNTER — Encounter: Payer: Self-pay | Admitting: Obstetrics & Gynecology

## 2014-03-13 VITALS — BP 123/83 | HR 99 | Temp 99.8°F | Wt 134.0 lb

## 2014-03-13 DIAGNOSIS — Z3400 Encounter for supervision of normal first pregnancy, unspecified trimester: Secondary | ICD-10-CM

## 2014-03-13 DIAGNOSIS — O480 Post-term pregnancy: Secondary | ICD-10-CM

## 2014-03-13 NOTE — Progress Notes (Signed)
Subjective:    Carol Williamson is a 23 y.o. female being seen today for her obstetrical visit. She is at [redacted]w[redacted]d.   Patient reports no complaints. Fetal movement: normal.   Past Medical History  Diagnosis Date  . Medical history non-contributory     Past Surgical History  Procedure Laterality Date  . Hernia repair      Current outpatient prescriptions:Pediatric Multiple Vit-C-FA (FLINSTONES GUMMIES OMEGA-3 DHA PO), Take by mouth., Disp: , Rfl:  Current facility-administered medications:DTaP (TRIPEDIA) injection 0.5 mL, 0.5 mL, Intramuscular, Once, [name], MD No Known Allergies  History  Substance Use Topics  . Smoking status: Never Smoker   . Smokeless tobacco: Never Used  . Alcohol Use: No    Family History  Problem Relation Age of Onset  . Hypertension Father   . Diabetes Father      Review of Systems Constitutional: negative for anorexia Gastrointestinal: negative for abdominal pain Genitourinary:negative for vaginal discharge Musculoskeletal:negative for back pain Behavioral/Psych: negative for depression and tobacco use   Objective:    BP 123/83  Pulse 99  Temp(Src) 99.8 F (37.7 C)  Wt 60.782 kg (134 lb)  LMP 06/06/2013 FHT:  130 BPM  Uterine Size: size equals dates  Presentation: cephalic     Assessment:    Pregnancy @ [redacted]w[redacted]d  weeks   Plan:     labs reviewed, problem list updated  Orders Placed This Encounter  Procedures  . Fetal non-stress test    Standing Status: Standing     Number of Occurrences: 1     Standing Expiration Date:   . POCT urinalysis dipstick   IOL in 1 weekk BPP next week Follow up in 1 Week.

## 2014-03-13 NOTE — Patient Instructions (Signed)
Labor Induction  Labor induction is when steps are taken to cause a pregnant woman to begin the labor process. Most women go into labor on their own between 37 weeks and 42 weeks of the pregnancy. When this does not happen or when there is a medical need, methods may be used to induce labor. Labor induction causes a pregnant woman's uterus to contract. It also causes the cervix to soften (ripen), open (dilate), and thin out (efface). Usually, labor is not induced before 39 weeks of the pregnancy unless there is a problem with the baby or mother.  Before inducing labor, your health care provider will consider a number of factors, including the following:  The medical condition of you and the baby.   How many weeks along you are.   The status of the baby's lung maturity.   The condition of the cervix.   The position of the baby.  WHAT ARE THE REASONS FOR LABOR INDUCTION? Labor may be induced for the following reasons:  The health of the baby or mother is at risk.   The pregnancy is overdue by 1 week or more.   The water breaks but labor does not start on its own.   The mother has a health condition or serious illness, such as high blood pressure, infection, placental abruption, or diabetes.  The amniotic fluid amounts are low around the baby.   The baby is distressed.  Convenience or wanting the baby to be born on a certain date is not a reason for inducing labor. WHAT METHODS ARE USED FOR LABOR INDUCTION? Several methods of labor induction may be used, such as:   Prostaglandin medicine. This medicine causes the cervix to dilate and ripen. The medicine will also start contractions. It can be taken by mouth or by inserting a suppository into the vagina.   Inserting a thin tube (catheter) with a balloon on the end into the vagina to dilate the cervix. Once inserted, the balloon is expanded with water, which causes the cervix to open.   Stripping the membranes. Your health  care provider separates amniotic sac tissue from the cervix, causing the cervix to be stretched and causing the release of a hormone called progesterone. This may cause the uterus to contract. It is often done during an office visit. You will be sent home to wait for the contractions to begin. You will then come in for an induction.   Breaking the water. Your health care provider makes a hole in the amniotic sac using a small instrument. Once the amniotic sac breaks, contractions should begin. This may still take hours to see an effect.   Medicine to trigger or strengthen contractions. This medicine is given through an IV access tube inserted into a vein in your arm.  All of the methods of induction, besides stripping the membranes, will be done in the hospital. Induction is done in the hospital so that you and the baby can be carefully monitored.  HOW LONG DOES IT TAKE FOR LABOR TO BE INDUCED? Some inductions can take up to 2 3 days. Depending on the cervix, it usually takes less time. It takes longer when you are induced early in the pregnancy or if this is your first pregnancy. If a mother is still pregnant and the induction has been going on for 2 3 days, either the mother will be sent home or a cesarean delivery will be needed. WHAT ARE THE RISKS ASSOCIATED WITH LABOR INDUCTION? Some of the risks   of induction include:   Changes in fetal heart rate, such as too high, too low, or erratic.   Fetal distress.   Chance of infection for the mother and baby.   Increased chance of having a cesarean delivery.   Breaking off (abruption) of the placenta from the uterus (rare).   Uterine rupture (very rare).  When induction is needed for medical reasons, the benefits of induction may outweigh the risks. WHAT ARE SOME REASONS FOR NOT INDUCING LABOR? Labor induction should not be done if:   It is shown that your baby does not tolerate labor.   You have had previous surgeries on your  uterus, such as a myomectomy or the removal of fibroids.   Your placenta lies very low in the uterus and blocks the opening of the cervix (placenta previa).   Your baby is not in a head-down position.   The umbilical cord drops down into the birth canal in front of the baby. This could cut off the baby's blood and oxygen supply.   You have had a previous cesarean delivery.   There are unusual circumstances, such as the baby being extremely premature.  Document Released: 02/15/2007 Document Revised: 05/29/2013 Document Reviewed: 04/25/2013 [employer] Patient Information 2014 [employer].  

## 2014-03-14 ENCOUNTER — Encounter (HOSPITAL_COMMUNITY): Payer: Self-pay | Admitting: *Deleted

## 2014-03-14 ENCOUNTER — Telehealth (HOSPITAL_COMMUNITY): Payer: Self-pay | Admitting: *Deleted

## 2014-03-14 ENCOUNTER — Inpatient Hospital Stay (HOSPITAL_COMMUNITY): Admitting: Anesthesiology

## 2014-03-14 ENCOUNTER — Inpatient Hospital Stay (HOSPITAL_COMMUNITY)
Admission: AD | Admit: 2014-03-14 | Discharge: 2014-03-14 | Disposition: A | Discharge status: Home / Self Care | Source: Ambulatory Visit | Attending: Obstetrics | Admitting: Obstetrics

## 2014-03-14 ENCOUNTER — Encounter (HOSPITAL_COMMUNITY): Payer: Self-pay

## 2014-03-14 ENCOUNTER — Inpatient Hospital Stay (HOSPITAL_COMMUNITY)
Admission: AD | Admit: 2014-03-14 | Discharge: 2014-03-17 | DRG: 775 | Disposition: A | Discharge status: Home / Self Care | Source: Ambulatory Visit | Attending: Obstetrics | Admitting: Obstetrics

## 2014-03-14 ENCOUNTER — Inpatient Hospital Stay (HOSPITAL_COMMUNITY)
Admission: AD | Admit: 2014-03-14 | Discharge: 2014-03-14 | Disposition: A | Discharge status: Home / Self Care | Source: Ambulatory Visit | Attending: Obstetrics & Gynecology | Admitting: Obstetrics & Gynecology

## 2014-03-14 ENCOUNTER — Encounter (HOSPITAL_COMMUNITY): Admitting: Anesthesiology

## 2014-03-14 DIAGNOSIS — O99820 Streptococcus B carrier state complicating pregnancy: Secondary | ICD-10-CM

## 2014-03-14 DIAGNOSIS — Z3490 Encounter for supervision of normal pregnancy, unspecified, unspecified trimester: Secondary | ICD-10-CM

## 2014-03-14 DIAGNOSIS — Z3400 Encounter for supervision of normal first pregnancy, unspecified trimester: Secondary | ICD-10-CM

## 2014-03-14 DIAGNOSIS — O9903 Anemia complicating the puerperium: Secondary | ICD-10-CM | POA: Diagnosis present

## 2014-03-14 DIAGNOSIS — Z22330 Carrier of Group B streptococcus: Secondary | ICD-10-CM

## 2014-03-14 DIAGNOSIS — Z8249 Family history of ischemic heart disease and other diseases of the circulatory system: Secondary | ICD-10-CM

## 2014-03-14 DIAGNOSIS — D649 Anemia, unspecified: Secondary | ICD-10-CM | POA: Diagnosis present

## 2014-03-14 DIAGNOSIS — O99892 Other specified diseases and conditions complicating childbirth: Secondary | ICD-10-CM | POA: Diagnosis present

## 2014-03-14 DIAGNOSIS — Z833 Family history of diabetes mellitus: Secondary | ICD-10-CM

## 2014-03-14 DIAGNOSIS — IMO0001 Reserved for inherently not codable concepts without codable children: Secondary | ICD-10-CM

## 2014-03-14 DIAGNOSIS — O9989 Other specified diseases and conditions complicating pregnancy, childbirth and the puerperium: Secondary | ICD-10-CM

## 2014-03-14 LAB — RPR

## 2014-03-14 LAB — CBC
HCT: 34.2 % — ABNORMAL LOW (ref 36.0–46.0)
Hemoglobin: 10.9 g/dL — ABNORMAL LOW (ref 12.0–15.0)
MCH: 26.1 pg (ref 26.0–34.0)
MCHC: 31.9 g/dL (ref 30.0–36.0)
MCV: 82 fL (ref 78.0–100.0)
Platelets: 247 10*3/uL (ref 150–400)
RBC: 4.17 MIL/uL (ref 3.87–5.11)
RDW: 14.4 % (ref 11.5–15.5)
WBC: 15.1 10*3/uL — ABNORMAL HIGH (ref 4.0–10.5)

## 2014-03-14 LAB — POCT FERN TEST: POCT FERN TEST: POSITIVE

## 2014-03-14 MED ORDER — FLEET ENEMA 7-19 GM/118ML RE ENEM: 1.0000 | ENEMA | RECTAL | Status: DC | PRN

## 2014-03-14 MED ORDER — OXYTOCIN 40 UNITS IN LACTATED RINGERS INFUSION - SIMPLE MED
1.0000 m[IU]/min | INTRAVENOUS | Status: DC
  Administered 2014-03-15: 1 m[IU]/min via INTRAVENOUS
  Filled 2014-03-14: qty 1000

## 2014-03-14 MED ORDER — OXYTOCIN BOLUS FROM INFUSION: 500.0000 mL | INTRAVENOUS | Status: DC

## 2014-03-14 MED ORDER — EPHEDRINE 5 MG/ML INJ
10.0000 mg | INTRAVENOUS | Status: DC | PRN
  Filled 2014-03-14: qty 2

## 2014-03-14 MED ORDER — OXYTOCIN 40 UNITS IN LACTATED RINGERS INFUSION - SIMPLE MED: 62.5000 mL/h | INTRAVENOUS | Status: DC

## 2014-03-14 MED ORDER — LIDOCAINE HCL (PF) 1 % IJ SOLN
30.0000 mL | INTRAMUSCULAR | Status: DC | PRN
  Filled 2014-03-14: qty 30

## 2014-03-14 MED ORDER — DIPHENHYDRAMINE HCL 50 MG/ML IJ SOLN: 12.5000 mg | INTRAMUSCULAR | Status: DC | PRN

## 2014-03-14 MED ORDER — LACTATED RINGERS IV SOLN: 500.0000 mL | Freq: Once | INTRAVENOUS | Status: DC

## 2014-03-14 MED ORDER — FENTANYL 2.5 MCG/ML BUPIVACAINE 1/10 % EPIDURAL INFUSION (WH - ANES)
INTRAMUSCULAR | Status: DC | PRN
  Administered 2014-03-14: 14 mL/h via EPIDURAL

## 2014-03-14 MED ORDER — PROMETHAZINE HCL 25 MG/ML IJ SOLN
25.0000 mg | Freq: Once | INTRAMUSCULAR | Status: AC
  Administered 2014-03-14: 25 mg via INTRAVENOUS
  Filled 2014-03-14: qty 1

## 2014-03-14 MED ORDER — IBUPROFEN 600 MG PO TABS: 600.0000 mg | ORAL_TABLET | Freq: Four times a day (QID) | ORAL | Status: DC | PRN

## 2014-03-14 MED ORDER — ACETAMINOPHEN 325 MG PO TABS: 650.0000 mg | ORAL_TABLET | ORAL | Status: DC | PRN

## 2014-03-14 MED ORDER — FENTANYL CITRATE 0.05 MG/ML IJ SOLN
INTRAMUSCULAR | Status: AC
  Filled 2014-03-14: qty 2

## 2014-03-14 MED ORDER — LIDOCAINE HCL (PF) 1 % IJ SOLN: 30.0000 mL | INTRAMUSCULAR | Status: DC | PRN

## 2014-03-14 MED ORDER — ONDANSETRON HCL 4 MG/2ML IJ SOLN: 4.0000 mg | Freq: Four times a day (QID) | INTRAMUSCULAR | Status: DC | PRN

## 2014-03-14 MED ORDER — ACETAMINOPHEN 325 MG PO TABS
650.0000 mg | ORAL_TABLET | ORAL | Status: DC | PRN
  Administered 2014-03-15 (×2): 650 mg via ORAL
  Filled 2014-03-14 (×2): qty 2

## 2014-03-14 MED ORDER — OXYCODONE-ACETAMINOPHEN 5-325 MG PO TABS: 1.0000 | ORAL_TABLET | ORAL | Status: DC | PRN

## 2014-03-14 MED ORDER — TERBUTALINE SULFATE 1 MG/ML IJ SOLN: 0.2500 mg | Freq: Once | INTRAMUSCULAR | Status: AC | PRN

## 2014-03-14 MED ORDER — PENICILLIN G POTASSIUM 5000000 UNITS IJ SOLR
5.0000 10*6.[IU] | Freq: Once | INTRAVENOUS | Status: AC
  Administered 2014-03-14: 5 10*6.[IU] via INTRAVENOUS
  Filled 2014-03-14: qty 5

## 2014-03-14 MED ORDER — CITRIC ACID-SODIUM CITRATE 334-500 MG/5ML PO SOLN: 30.0000 mL | ORAL | Status: DC | PRN

## 2014-03-14 MED ORDER — PHENYLEPHRINE 40 MCG/ML (10ML) SYRINGE FOR IV PUSH (FOR BLOOD PRESSURE SUPPORT)
80.0000 ug | PREFILLED_SYRINGE | INTRAVENOUS | Status: DC | PRN
  Filled 2014-03-14: qty 10
  Filled 2014-03-14: qty 2

## 2014-03-14 MED ORDER — FENTANYL CITRATE 0.05 MG/ML IJ SOLN
100.0000 ug | Freq: Once | INTRAMUSCULAR | Status: AC
  Administered 2014-03-14: 100 ug via INTRAVENOUS

## 2014-03-14 MED ORDER — FENTANYL 2.5 MCG/ML BUPIVACAINE 1/10 % EPIDURAL INFUSION (WH - ANES)
14.0000 mL/h | INTRAMUSCULAR | Status: DC | PRN
  Administered 2014-03-15 (×3): 14 mL/h via EPIDURAL
  Filled 2014-03-14 (×4): qty 125

## 2014-03-14 MED ORDER — PENICILLIN G POTASSIUM 5000000 UNITS IJ SOLR
2.5000 10*6.[IU] | INTRAVENOUS | Status: DC
  Administered 2014-03-14 – 2014-03-15 (×6): 2.5 10*6.[IU] via INTRAVENOUS
  Filled 2014-03-14 (×9): qty 2.5

## 2014-03-14 MED ORDER — LACTATED RINGERS IV SOLN
INTRAVENOUS | Status: DC
  Administered 2014-03-14: 18:00:00 via INTRAVENOUS

## 2014-03-14 MED ORDER — LACTATED RINGERS IV SOLN
INTRAVENOUS | Status: DC
  Administered 2014-03-15 (×2): via INTRAVENOUS

## 2014-03-14 MED ORDER — EPHEDRINE 5 MG/ML INJ
10.0000 mg | INTRAVENOUS | Status: DC | PRN
  Filled 2014-03-14: qty 2
  Filled 2014-03-14: qty 4

## 2014-03-14 MED ORDER — ONDANSETRON HCL 4 MG/2ML IJ SOLN
4.0000 mg | Freq: Four times a day (QID) | INTRAMUSCULAR | Status: DC | PRN
  Administered 2014-03-15: 4 mg via INTRAVENOUS
  Filled 2014-03-14: qty 2

## 2014-03-14 MED ORDER — MORPHINE SULFATE 10 MG/ML IJ SOLN
10.0000 mg | Freq: Once | INTRAMUSCULAR | Status: AC
  Administered 2014-03-14: 10 mg via INTRAVENOUS
  Filled 2014-03-14: qty 1

## 2014-03-14 MED ORDER — LACTATED RINGERS IV SOLN: 500.0000 mL | INTRAVENOUS | Status: DC | PRN

## 2014-03-14 MED ORDER — LIDOCAINE HCL (PF) 1 % IJ SOLN
INTRAMUSCULAR | Status: DC | PRN
  Administered 2014-03-14 (×2): 8 mL

## 2014-03-14 MED ORDER — LACTATED RINGERS IV SOLN
500.0000 mL | INTRAVENOUS | Status: DC | PRN
  Administered 2014-03-15: 500 mL via INTRAVENOUS

## 2014-03-14 NOTE — Anesthesia Preprocedure Evaluation (Signed)

## 2014-03-14 NOTE — H&P (Signed)
Carol Williamson is a 23 y.o. female presenting for UC's. Maternal Medical History:  Reason for admission: Rupture of membranes and contractions.   Contractions: Frequency: regular.    Fetal activity: Perceived fetal activity is normal.   Last perceived fetal movement was within the past hour.    Prenatal complications: no prenatal complications Prenatal Complications - Diabetes: none.    OB History   Grav Para Term Preterm Abortions TAB SAB Ect Mult Living   1              Past Medical History  Diagnosis Date  . Medical history non-contributory    Past Surgical History  Procedure Laterality Date  . Hernia repair     Family History: family history includes Diabetes in her father; Hypertension in her father. Social History:  reports that she has never smoked. She has never used smokeless tobacco. She reports that she does not drink alcohol or use illicit drugs.   Prenatal Transfer Tool  Maternal Diabetes: No Genetic Screening: Normal Maternal Ultrasounds/Referrals: Normal Fetal Ultrasounds or other Referrals:  None Maternal Substance Abuse:  No Significant Maternal Medications:  None Significant Maternal Lab Results:  None Other Comments:  None  Review of Systems  All other systems reviewed and are negative.   Dilation: 8 Effacement (%): 100 Station: -2 Exam by:: C. Roxan Hockey RN; L. Barefoot NP Height 5' (1.524 m), weight 131 lb 9.6 oz (59.693 kg), last menstrual period 06/06/2013. Maternal Exam:  Uterine Assessment: Contraction strength is firm.  Contraction frequency is regular.   Abdomen: Patient reports no abdominal tenderness. Fetal presentation: vertex  Pelvis: adequate for delivery.   Cervix: Cervix evaluated by digital exam.     Physical Exam  Nursing note and vitals reviewed. Constitutional: She is oriented to person, place, and time. She appears well-developed and well-nourished.  HENT:  Head: Normocephalic and atraumatic.  Eyes: Conjunctivae are  normal. Pupils are equal, round, and reactive to light.  Neck: Normal range of motion. Neck supple.  Cardiovascular: Normal rate and regular rhythm.   Respiratory: Effort normal.  GI: Soft.  Musculoskeletal: Normal range of motion.  Neurological: She is alert and oriented to person, place, and time.  Skin: Skin is warm and dry.  Psychiatric: She has a normal mood and affect. Her behavior is normal. Judgment and thought content normal.    Prenatal labs: ABO, Rh: O/POS/-- (02/19 1543) Antibody: NEG (02/19 1543) Rubella: 8.41 (02/19 1543) RPR: NON REAC (03/17 1316)  HBsAg: NEGATIVE (02/19 1543)  HIV: NON REACTIVE (03/17 1316)  GBS: POSITIVE (05/06 1523)   Assessment/Plan: 40 weeks. Active labor.  Admit.   [name] 03/14/2014, 6:01 PM

## 2014-03-14 NOTE — Anesthesia Procedure Notes (Signed)
Epidural Patient location during procedure: OB Start time: 03/14/2014 6:54 PM End time: 03/14/2014 6:58 PM  Staffing Anesthesiologist: Leilani Able Performed by: anesthesiologist   Preanesthetic Checklist Completed: patient identified, surgical consent, pre-op evaluation, timeout performed, IV checked, risks and benefits discussed and monitors and equipment checked  Epidural Patient position: sitting Prep: site prepped and draped and DuraPrep Patient monitoring: continuous pulse ox and blood pressure Approach: midline Location: L3-L4 Injection technique: LOR air  Needle:  Needle type: Tuohy  Needle gauge: 17 G Needle length: 9 cm and 9 Needle insertion depth: 4 cm Catheter type: closed end flexible Catheter size: 19 Gauge Catheter at skin depth: 9 cm Test dose: negative  Assessment Sensory level: T9 Events: blood not aspirated, injection not painful, no injection resistance, negative IV test and no paresthesia  Additional Notes Reason for block:procedure for pain

## 2014-03-14 NOTE — MAU Note (Signed)
contractions 

## 2014-03-14 NOTE — H&P (Deleted)
Carol Williamson is a 23 y.o. female presenting for UC's. Maternal Medical History:  Reason for admission: Contractions.   Contractions: Frequency: regular.   Perceived severity is strong.    Fetal activity: Perceived fetal activity is normal.   Last perceived fetal movement was within the past hour.    Prenatal complications: no prenatal complications Prenatal Complications - Diabetes: none.    OB History   Grav Para Term Preterm Abortions TAB SAB Ect Mult Living   1              Past Medical History  Diagnosis Date  . Medical history non-contributory    Past Surgical History  Procedure Laterality Date  . Hernia repair     Family History: family history includes Diabetes in her father; Hypertension in her father. Social History:  reports that she has never smoked. She has never used smokeless tobacco. She reports that she does not drink alcohol or use illicit drugs.   Prenatal Transfer Tool  Maternal Diabetes: No Genetic Screening: Normal Maternal Ultrasounds/Referrals: Normal Fetal Ultrasounds or other Referrals:  None Maternal Substance Abuse:  No Significant Maternal Medications:  None Significant Maternal Lab Results:  None Other Comments:  None  Review of Systems  All other systems reviewed and are negative.   Dilation: 8 Effacement (%): 100 Station: -2 Exam by::  L. Barefoot NP Blood pressure 129/83, pulse 106, temperature 98.2 F (36.8 C), temperature source Oral, resp. rate 20, height 5' (1.524 m), weight 131 lb 9.6 oz (59.693 kg), last menstrual period 06/06/2013, SpO2 98.00%. Maternal Exam:  Uterine Assessment: Contraction strength is firm.  Contraction frequency is regular.   Abdomen: Patient reports no abdominal tenderness. Fetal presentation: vertex  Cervix: Cervix evaluated by digital exam.     Physical Exam  Nursing note and vitals reviewed. Constitutional: She is oriented to person, place, and time. She appears well-developed and  well-nourished.  HENT:  Head: Normocephalic and atraumatic.  Eyes: Conjunctivae are normal. Pupils are equal, round, and reactive to light.  Neck: Normal range of motion. Neck supple.  Cardiovascular: Normal rate and regular rhythm.   Respiratory: Effort normal.  GI: Soft.  Genitourinary: Vagina normal and uterus normal.  Musculoskeletal: Normal range of motion.  Neurological: She is alert and oriented to person, place, and time.  Skin: Skin is warm and dry.  Psychiatric: She has a normal mood and affect. Her behavior is normal. Judgment and thought content normal.    Prenatal labs: ABO, Rh: O/POS/-- (02/19 1543) Antibody: NEG (02/19 1543) Rubella: 8.41 (02/19 1543) RPR: NON REAC (03/17 1316)  HBsAg: NEGATIVE (02/19 1543)  HIV: NON REACTIVE (03/17 1316)  GBS: POSITIVE (05/06 1523)   Assessment/Plan: 40 weeks.  Active labor.  Admit.   [name] 03/14/2014, 6:46 PM

## 2014-03-14 NOTE — Progress Notes (Signed)
Mariely Balyeat is a 23 y.o. G1P0 at [redacted]w[redacted]d by LMP admitted for active labor  Subjective:   Objective: BP 104/58  Pulse 110  Temp(Src) 98.2 F (36.8 C) (Oral)  Resp 16  Ht 5' (1.524 m)  Wt 131 lb 9.6 oz (59.693 kg)  BMI 25.70 kg/m2  SpO2 100%  LMP 06/06/2013      FHT:  FHR: 140-150 bpm, variability: moderate,  accelerations:  Present,  decelerations:  Present variable UC:   regular, every 2-5 minutes SVE:   Dilation: 3 Effacement (%): 80 Station: -1 Exam by:: A. [place], RNC  Labs: Lab Results  Component Value Date   WBC 15.1* 03/14/2014   HGB 10.9* 03/14/2014   HCT 34.2* 03/14/2014   MCV 82.0 03/14/2014   PLT 247 03/14/2014    Assessment / Plan: Spontaneous labor, progressing normally  Labor: Progressing normally Preeclampsia:  n/a Fetal Wellbeing:  Category I Pain Control:  Epidural I/D:  n/a Anticipated MOD:  NSVD  [name] 03/14/2014, 11:54 PM

## 2014-03-14 NOTE — Discharge Instructions (Signed)
Braxton Hicks Contractions Pregnancy is commonly associated with contractions of the uterus throughout the pregnancy. Towards the end of pregnancy (32 to 34 weeks), these contractions (Braxton Hicks) can develop more often and may become more forceful. This is not true labor because these contractions do not result in opening (dilatation) and thinning of the cervix. They are sometimes difficult to tell apart from true labor because these contractions can be forceful and people have different pain tolerances. You should not feel embarrassed if you go to the hospital with false labor. Sometimes, the only way to tell if you are in true labor is for your caregiver to follow the changes in the cervix. How to tell the difference between true and false labor:  False labor.  The contractions of false labor are usually shorter, irregular and not as hard as those of true labor.  They are often felt in the front of the lower abdomen and in the groin.  They may leave with walking around or changing positions while lying down.  They get weaker and are shorter lasting as time goes on.  These contractions are usually irregular.  They do not usually become progressively stronger, regular and closer together as with true labor.  True labor.  Contractions in true labor last 30 to 70 seconds, become very regular, usually become more intense, and increase in frequency.  They do not go away with walking.  The discomfort is usually felt in the top of the uterus and spreads to the lower abdomen and low back.  True labor can be determined by your caregiver with an exam. This will show that the cervix is dilating and getting thinner. If there are no prenatal problems or other health problems associated with the pregnancy, it is completely safe to be sent home with false labor and await the onset of true labor. HOME CARE INSTRUCTIONS   Keep up with your usual exercises and instructions.  Take medications as  directed.  Keep your regular prenatal appointment.  Eat and drink lightly if you think you are going into labor.  If BH contractions are making you uncomfortable:  Change your activity position from lying down or resting to walking/walking to resting.  Sit and rest in a tub of warm water.  Drink 2 to 3 glasses of water. Dehydration may cause B-H contractions.  Do slow and deep breathing several times an hour. SEEK IMMEDIATE MEDICAL CARE IF:   Your contractions continue to become stronger, more regular, and closer together.  You have a gushing, burst or leaking of fluid from the vagina.  An oral temperature above 102 F (38.9 C) develops.  You have passage of blood-tinged mucus.  You develop vaginal bleeding.  You develop continuous belly (abdominal) pain.  You have low back pain that you never had before.  You feel the baby's head pushing down causing pelvic pressure.  The baby is not moving as much as it used to. Document Released: 09/26/2005 Document Revised: 12/19/2011 Document Reviewed: 07/08/2013 Exit[employer] Patient Information 2014 [employer].  Fetal Movement Counts Patient Name: __________________________________________________ Patient Due Date: ____________________ Performing a fetal movement count is highly recommended in high-risk pregnancies, but it is good for every pregnant woman to do. Your caregiver may ask you to start counting fetal movements at 28 weeks of the pregnancy. Fetal movements often increase:  After eating a full meal.  After physical activity.  After eating or drinking something sweet or cold.  At rest. Pay attention to when you feel   the baby is most active. This will help you notice a pattern of your baby's sleep and wake cycles and what factors contribute to an increase in fetal movement. It is important to perform a fetal movement count at the same time each day when your baby is normally most active.  HOW TO COUNT FETAL  MOVEMENTS 1. Find a quiet and comfortable area to sit or lie down on your left side. Lying on your left side provides the best blood and oxygen circulation to your baby. 2. Write down the day and time on a sheet of paper or in a journal. 3. Start counting kicks, flutters, swishes, rolls, or jabs in a 2 hour period. You should feel at least 10 movements within 2 hours. 4. If you do not feel 10 movements in 2 hours, wait 2 3 hours and count again. Look for a change in the pattern or not enough counts in 2 hours. SEEK MEDICAL CARE IF:  You feel less than 10 counts in 2 hours, tried twice.  There is no movement in over an hour.  The pattern is changing or taking longer each day to reach 10 counts in 2 hours.  You feel the baby is not moving as he or she usually does. Date: ____________ Movements: ____________ Start time: ____________ Finish time: ____________  Date: ____________ Movements: ____________ Start time: ____________ Finish time: ____________ Date: ____________ Movements: ____________ Start time: ____________ Finish time: ____________ Date: ____________ Movements: ____________ Start time: ____________ Finish time: ____________ Date: ____________ Movements: ____________ Start time: ____________ Finish time: ____________ Date: ____________ Movements: ____________ Start time: ____________ Finish time: ____________ Date: ____________ Movements: ____________ Start time: ____________ Finish time: ____________ Date: ____________ Movements: ____________ Start time: ____________ Finish time: ____________  Date: ____________ Movements: ____________ Start time: ____________ Finish time: ____________ Date: ____________ Movements: ____________ Start time: ____________ Finish time: ____________ Date: ____________ Movements: ____________ Start time: ____________ Finish time: ____________ Date: ____________ Movements: ____________ Start time: ____________ Finish time: ____________ Date: ____________  Movements: ____________ Start time: ____________ Finish time: ____________ Date: ____________ Movements: ____________ Start time: ____________ Finish time: ____________ Date: ____________ Movements: ____________ Start time: ____________ Finish time: ____________  Date: ____________ Movements: ____________ Start time: ____________ Finish time: ____________ Date: ____________ Movements: ____________ Start time: ____________ Finish time: ____________ Date: ____________ Movements: ____________ Start time: ____________ Finish time: ____________ Date: ____________ Movements: ____________ Start time: ____________ Finish time: ____________ Date: ____________ Movements: ____________ Start time: ____________ Finish time: ____________ Date: ____________ Movements: ____________ Start time: ____________ Finish time: ____________ Date: ____________ Movements: ____________ Start time: ____________ Finish time: ____________  Date: ____________ Movements: ____________ Start time: ____________ Finish time: ____________ Date: ____________ Movements: ____________ Start time: ____________ Finish time: ____________ Date: ____________ Movements: ____________ Start time: ____________ Finish time: ____________ Date: ____________ Movements: ____________ Start time: ____________ Finish time: ____________ Date: ____________ Movements: ____________ Start time: ____________ Finish time: ____________ Date: ____________ Movements: ____________ Start time: ____________ Finish time: ____________ Date: ____________ Movements: ____________ Start time: ____________ Finish time: ____________  Date: ____________ Movements: ____________ Start time: ____________ Finish time: ____________ Date: ____________ Movements: ____________ Start time: ____________ Finish time: ____________ Date: ____________ Movements: ____________ Start time: ____________ Finish time: ____________ Date: ____________ Movements: ____________ Start time:  ____________ Finish time: ____________ Date: ____________ Movements: ____________ Start time: ____________ Finish time: ____________ Date: ____________ Movements: ____________ Start time: ____________ Finish time: ____________ Date: ____________ Movements: ____________ Start time: ____________ Finish time: ____________  Date: ____________ Movements: ____________ Start time: ____________ Finish time: ____________ Date: ____________ Movements: ____________ Start   time: ____________ Finish time: ____________ Date: ____________ Movements: ____________ Start time: ____________ Finish time: ____________ Date: ____________ Movements: ____________ Start time: ____________ Finish time: ____________ Date: ____________ Movements: ____________ Start time: ____________ Finish time: ____________ Date: ____________ Movements: ____________ Start time: ____________ Finish time: ____________ Date: ____________ Movements: ____________ Start time: ____________ Finish time: ____________  Date: ____________ Movements: ____________ Start time: ____________ Finish time: ____________ Date: ____________ Movements: ____________ Start time: ____________ Finish time: ____________ Date: ____________ Movements: ____________ Start time: ____________ Finish time: ____________ Date: ____________ Movements: ____________ Start time: ____________ Finish time: ____________ Date: ____________ Movements: ____________ Start time: ____________ Finish time: ____________ Date: ____________ Movements: ____________ Start time: ____________ Finish time: ____________ Date: ____________ Movements: ____________ Start time: ____________ Finish time: ____________  Date: ____________ Movements: ____________ Start time: ____________ Finish time: ____________ Date: ____________ Movements: ____________ Start time: ____________ Finish time: ____________ Date: ____________ Movements: ____________ Start time: ____________ Finish time: ____________ Date:  ____________ Movements: ____________ Start time: ____________ Finish time: ____________ Date: ____________ Movements: ____________ Start time: ____________ Finish time: ____________ Date: ____________ Movements: ____________ Start time: ____________ Finish time: ____________ Document Released: 10/26/2006 Document Revised: 09/12/2012 Document Reviewed: 07/23/2012 [employer] Patient Information 2014 [employer].  

## 2014-03-14 NOTE — MAU Note (Signed)
Patient states she is having contractions every 4 minutes with bloody show. Denies leaking fluid and reports some fetal movement this am but not as much as usual.

## 2014-03-14 NOTE — Telephone Encounter (Signed)
Preadmission screen  

## 2014-03-14 NOTE — MAU Note (Signed)
Patient presents to MAU with c/o leaking of clear fluid since 1330 today; not currently wearing a pad. States fluid has been 'trickling'. Contracting since 1am this morning 5 minutes apart. Denies VB at this time . Reports good fetal movement.

## 2014-03-14 NOTE — MAU Note (Signed)
Seen in hospital few hours ago, 1 cm contractions are stronger

## 2014-03-15 ENCOUNTER — Encounter (HOSPITAL_COMMUNITY): Payer: Self-pay | Admitting: *Deleted

## 2014-03-15 DIAGNOSIS — Z008 Encounter for other general examination: Secondary | ICD-10-CM

## 2014-03-15 MED ORDER — SENNOSIDES-DOCUSATE SODIUM 8.6-50 MG PO TABS
2.0000 | ORAL_TABLET | ORAL | Status: DC
  Administered 2014-03-16 (×2): 2 via ORAL
  Filled 2014-03-15 (×2): qty 2

## 2014-03-15 MED ORDER — TETANUS-DIPHTH-ACELL PERTUSSIS 5-2.5-18.5 LF-MCG/0.5 IM SUSP: 0.5000 mL | Freq: Once | INTRAMUSCULAR | Status: DC

## 2014-03-15 MED ORDER — ZOLPIDEM TARTRATE 5 MG PO TABS: 5.0000 mg | ORAL_TABLET | Freq: Every evening | ORAL | Status: DC | PRN

## 2014-03-15 MED ORDER — WITCH HAZEL-GLYCERIN EX PADS: 1.0000 "application " | MEDICATED_PAD | CUTANEOUS | Status: DC | PRN

## 2014-03-15 MED ORDER — OXYTOCIN 40 UNITS IN LACTATED RINGERS INFUSION - SIMPLE MED: 1.0000 m[IU]/min | INTRAVENOUS | Status: DC

## 2014-03-15 MED ORDER — BENZOCAINE-MENTHOL 20-0.5 % EX AERO: 1.0000 "application " | INHALATION_SPRAY | CUTANEOUS | Status: DC | PRN

## 2014-03-15 MED ORDER — SIMETHICONE 80 MG PO CHEW: 80.0000 mg | CHEWABLE_TABLET | ORAL | Status: DC | PRN

## 2014-03-15 MED ORDER — LANOLIN HYDROUS EX OINT: TOPICAL_OINTMENT | CUTANEOUS | Status: DC | PRN

## 2014-03-15 MED ORDER — ONDANSETRON HCL 4 MG/2ML IJ SOLN: 4.0000 mg | INTRAMUSCULAR | Status: DC | PRN

## 2014-03-15 MED ORDER — DIBUCAINE 1 % RE OINT: 1.0000 "application " | TOPICAL_OINTMENT | RECTAL | Status: DC | PRN

## 2014-03-15 MED ORDER — DIPHENHYDRAMINE HCL 25 MG PO CAPS: 25.0000 mg | ORAL_CAPSULE | Freq: Four times a day (QID) | ORAL | Status: DC | PRN

## 2014-03-15 MED ORDER — MEDROXYPROGESTERONE ACETATE 150 MG/ML IM SUSP: 150.0000 mg | INTRAMUSCULAR | Status: DC | PRN

## 2014-03-15 MED ORDER — IBUPROFEN 600 MG PO TABS
600.0000 mg | ORAL_TABLET | Freq: Four times a day (QID) | ORAL | Status: DC
  Administered 2014-03-16 – 2014-03-17 (×6): 600 mg via ORAL
  Filled 2014-03-15 (×6): qty 1

## 2014-03-15 MED ORDER — PRENATAL MULTIVITAMIN CH
1.0000 | ORAL_TABLET | Freq: Every day | ORAL | Status: DC
  Administered 2014-03-16: 1 via ORAL
  Filled 2014-03-15: qty 1

## 2014-03-15 MED ORDER — OXYCODONE-ACETAMINOPHEN 5-325 MG PO TABS: 1.0000 | ORAL_TABLET | ORAL | Status: DC | PRN

## 2014-03-15 MED ORDER — ONDANSETRON HCL 4 MG PO TABS: 4.0000 mg | ORAL_TABLET | ORAL | Status: DC | PRN

## 2014-03-15 NOTE — Progress Notes (Signed)
Instructed to maintain pitocin if possible at 61mu per dr Clearance Coots.

## 2014-03-15 NOTE — Progress Notes (Signed)
Carol Williamson is a 23 y.o. G1P0 at [redacted]w[redacted]d by LMP admitted for active labor  Subjective:   Objective: BP 103/75  Pulse 111  Temp(Src) 98.9 F (37.2 C) (Oral)  Resp 16  Ht 5' (1.524 m)  Wt 131 lb 9.6 oz (59.693 kg)  BMI 25.70 kg/m2  SpO2 100%  LMP 06/06/2013      FHT:  FHR: 150 bpm, variability: moderate,  accelerations:  Present,  decelerations:  Present variable UC:   regular, every 2-4 minutes SVE:   Dilation: 4 Effacement (%): 90 Station: 0 Exam by:: A. [place], RNC  Labs: Lab Results  Component Value Date   WBC 15.1* 03/14/2014   HGB 10.9* 03/14/2014   HCT 34.2* 03/14/2014   MCV 82.0 03/14/2014   PLT 247 03/14/2014    Assessment / Plan: Spontaneous labor, progressing normally  Labor: Progressing normally Preeclampsia:  n/a Fetal Wellbeing:  Category I Pain Control:  Epidural I/D:  n/a Anticipated MOD:  NSVD  [name] 03/15/2014, 6:46 AM

## 2014-03-15 NOTE — Progress Notes (Signed)
Lamanda Wahlman is a 23 y.o. G1P0 at [redacted]w[redacted]d by LMP admitted for active labor  Subjective:   Objective: BP 121/77  Pulse 82  Temp(Src) 100.1 F (37.8 C) (Axillary)  Resp 18  Ht 5' (1.524 m)  Wt 131 lb 9.6 oz (59.693 kg)  BMI 25.70 kg/m2  SpO2 100%  LMP 06/06/2013   Total I/O In: 118 [P.O.:118] Out: 450 [Urine:450]  FHT:  FHR: 160 bpm, variability: moderate,  accelerations:  Present,  decelerations:  Absent UC:   regular, every 2-3 minutes SVE:   Dilation: 5.5 Effacement (%): 100 Station: 0;+1 Exam by:: [email or web address],rnc-ob  Labs: Lab Results  Component Value Date   WBC 15.1* 03/14/2014   HGB 10.9* 03/14/2014   HCT 34.2* 03/14/2014   MCV 82.0 03/14/2014   PLT 247 03/14/2014    Assessment / Plan: Spontaneous labor, progressing normally  Labor: Progressing normally Preeclampsia:  n/a Fetal Wellbeing:  Category I Pain Control:  Epidural I/D:  n/a Anticipated MOD:  NSVD  [name] 03/15/2014, 10:58 AM

## 2014-03-15 NOTE — Progress Notes (Signed)
Carol Williamson is a 23 y.o. G1P0 at [redacted]w[redacted]d by LMP admitted for active labor  Subjective:   Objective: BP 123/76  Pulse 72  Temp(Src) 100 F (37.8 C) (Axillary)  Resp 18  Ht 5' (1.524 m)  Wt 131 lb 9.6 oz (59.693 kg)  BMI 25.70 kg/m2  SpO2 100%  LMP 06/06/2013 I/O last 3 completed shifts: In: 118 [P.O.:118] Out: 1050 [Urine:1050]    FHT:  FHR: 150 bpm, variability: moderate,  accelerations:  Present,  decelerations:  Absent UC:   regular, every 2-3 minutes SVE:   Dilation: 9 Effacement (%): 100 Station: +2 Exam by:: A. Tuttle RNC  Labs: Lab Results  Component Value Date   WBC 15.1* 03/14/2014   HGB 10.9* 03/14/2014   HCT 34.2* 03/14/2014   MCV 82.0 03/14/2014   PLT 247 03/14/2014    Assessment / Plan: Spontaneous labor, progressing normally  Labor: Progressing normally Preeclampsia:  n/a Fetal Wellbeing:  Category I Pain Control:  Epidural I/D:  n/a Anticipated MOD:  NSVD  Carol Williamson 03/15/2014, 8:44 PM

## 2014-03-16 ENCOUNTER — Other Ambulatory Visit: Payer: Self-pay | Admitting: *Deleted

## 2014-03-16 LAB — CBC
HCT: 26.3 % — ABNORMAL LOW (ref 36.0–46.0)
Hemoglobin: 8.5 g/dL — ABNORMAL LOW (ref 12.0–15.0)
MCH: 26.6 pg (ref 26.0–34.0)
MCHC: 32.3 g/dL (ref 30.0–36.0)
MCV: 82.4 fL (ref 78.0–100.0)
PLATELETS: 207 10*3/uL (ref 150–400)
RBC: 3.19 MIL/uL — ABNORMAL LOW (ref 3.87–5.11)
RDW: 14.5 % (ref 11.5–15.5)
WBC: 24.7 10*3/uL — ABNORMAL HIGH (ref 4.0–10.5)

## 2014-03-16 NOTE — Lactation Note (Signed)
This note was copied from the chart of Carol Cornetta Keister. Lactation Consultation Note Initial visit at 25 hours of age.  Mom reports baby just ate for 10 minutes and unlatched.  Mom denies pain and is able to hand express colostrum.  Assisted with cross cradle hold to attempt latch, baby is not interested at this time.  Mom is burping baby on her chest. [facility] LC resources given and discussed.  Encouraged to feed with early cues on demand.  Early newborn behavior and feeding frequency discussed.  Mom to call for assist as needed.    Patient Name: Carol Williamson [id number] Date: 03/16/2014 Reason for consult: Initial assessment   Maternal Data    Feeding Feeding Type: Breast Fed Length of feed: 20 min  LATCH Score/Interventions Latch: Grasps breast easily, tongue down, lips flanged, rhythmical sucking. Intervention(s): Adjust position;Assist with latch  Audible Swallowing: A few with stimulation Intervention(s): Skin to skin;Hand expression Intervention(s): Skin to skin;Hand expression  Type of Nipple: Everted at rest and after stimulation (short; mom has hand pump to pre-pump)  Comfort (Breast/Nipple): Soft / non-tender     Hold (Positioning): Assistance needed to correctly position infant at breast and maintain latch. Intervention(s): Breastfeeding basics reviewed  LATCH Score: 8  Lactation Tools Discussed/Used     Consult Status Consult Status: Follow-up Date: 03/17/14 Follow-up type: In-patient    [name] 03/16/2014, 10:34 PM

## 2014-03-16 NOTE — Anesthesia Postprocedure Evaluation (Signed)
Anesthesia Post Note  Patient: Carol Williamson  Procedure(s) Performed: * No procedures listed *  Anesthesia type: Epidural  Patient location: Mother/Baby  Post pain: Pain level controlled  Post assessment: Post-op Vital signs reviewed  Last Vitals:  Filed Vitals:   03/16/14 0430  BP: 114/78  Pulse: 65  Temp: 36.3 C  Resp: 18    Post vital signs: Reviewed  Level of consciousness:alert  Complications: No apparent anesthesia complications

## 2014-03-16 NOTE — Progress Notes (Signed)
Subjective: Postpartum Day 1: Cesarean Delivery Patient reports tolerating PO.    Objective: Vital signs in last 24 hours: Temp:  [97.3 F (36.3 C)-100.1 F (37.8 C)] 97.3 F (36.3 C) (06/07 0430) Pulse Rate:  [65-209] 65 (06/07 0430) Resp:  [18-20] 18 (06/07 0430) BP: (72-132)/(49-95) 114/78 mmHg (06/07 0430) SpO2:  [97 %-99 %] 98 % (06/07 0430)  Physical Exam:  General: alert and no distress Lochia: appropriate Uterine Fundus: firm Incision: healing well DVT Evaluation: No evidence of DVT seen on physical exam.   Recent Labs  03/14/14 1815  HGB 10.9*  HCT 34.2*    Assessment/Plan: Status post Cesarean section. Doing well postoperatively.  Continue current care.  [name] 03/16/2014, 6:26 AM

## 2014-03-16 NOTE — Lactation Note (Signed)
This note was copied from the chart of Carol Karlisha Scherber. Lactation Consultation Note Intial visit attempt at 23 hours of age.  Mom is resting and reports baby just ate.  Baby has not voided, but has had 7 stools and 9 charted feedings.  Feedings with latch score indicate 0 for swallows.  [facility] resources given to mom, but not discussed at this visit, encouraged mom to call for assist with next feeding.  [facility] RN updated on visit attempt.   Patient Name: Carol Williamson [id number] Date: 03/16/2014     Maternal Data    Feeding Feeding Type: Breast Fed Length of feed: 20 min  LATCH Score/Interventions                      Lactation Tools Discussed/Used     Consult Status      [name] 03/16/2014, 8:47 PM

## 2014-03-17 LAB — POCT URINALYSIS DIPSTICK
BILIRUBIN UA: NEGATIVE
Blood, UA: NEGATIVE
GLUCOSE UA: NEGATIVE
KETONES UA: NEGATIVE
Nitrite, UA: NEGATIVE
Protein, UA: NEGATIVE
Spec Grav, UA: <=1.005
Urobilinogen, UA: NEGATIVE
pH, UA: 7

## 2014-03-17 MED ORDER — IBUPROFEN 600 MG PO TABS: 600.0000 mg | ORAL_TABLET | Freq: Four times a day (QID) | ORAL | Status: DC | PRN

## 2014-03-17 MED ORDER — OXYCODONE-ACETAMINOPHEN 5-325 MG PO TABS: 1.0000 | ORAL_TABLET | ORAL | Status: DC | PRN

## 2014-03-17 MED ORDER — FUSION PLUS PO CAPS: 1.0000 | ORAL_CAPSULE | Freq: Every day | ORAL | Status: DC

## 2014-03-17 NOTE — Progress Notes (Signed)
Post Partum Day 2 Subjective: no complaints  Objective: Blood pressure 110/64, pulse 63, temperature 97.9 F (36.6 C), temperature source Oral, resp. rate 16, height 5' (1.524 m), weight 131 lb 9.6 oz (59.693 kg), last menstrual period 06/06/2013, SpO2 99.00%, unknown if currently breastfeeding.  Physical Exam:  General: alert and no distress Lochia: appropriate Uterine Fundus: firm Incision: none DVT Evaluation: No evidence of DVT seen on physical exam.   Recent Labs  03/14/14 1815 03/16/14 0605  HGB 10.9* 8.5*  HCT 34.2* 26.3*    Assessment/Plan: Discharge home   LOS: 3 days   Carol Williamson 03/17/2014, 6:36 AM

## 2014-03-17 NOTE — Discharge Summary (Signed)
Obstetric Discharge Summary Reason for Admission: onset of labor Prenatal Procedures: ultrasound Intrapartum Procedures: spontaneous vaginal delivery Postpartum Procedures: none Complications-Operative and Postpartum: none Hemoglobin  Date Value Ref Range Status  03/16/2014 8.5* 12.0 - 15.0 g/dL Final     REPEATED TO VERIFY     DELTA CHECK NOTED     HCT  Date Value Ref Range Status  03/16/2014 26.3* 36.0 - 46.0 % Final    Physical Exam:  General: alert and no distress Lochia: appropriate Uterine Fundus: firm Incision: none DVT Evaluation: No evidence of DVT seen on physical exam.  Discharge Diagnoses: Term Pregnancy-delivered.  Anemia.  Clinically stable.  Discharge Information: Date: 03/17/2014 Activity: pelvic rest Diet: routine Medications: PNV, Ibuprofen, Colace, Iron and Percocet Condition: stable Instructions: refer to practice specific booklet Discharge to: home Follow-up Information   Follow up with Antionette Char A, MD. Schedule an appointment as soon as possible for a visit in 2 weeks.   Specialty:  Obstetrics and Gynecology   Contact information:   [address] [place] [address] [phone]       Newborn Data: Live born female  Birth Weight: 6 lb 1 oz (2750 g) APGAR: 8, 9  Home with mother.  [name] 03/17/2014, 6:40 AM

## 2014-03-17 NOTE — Discharge Instructions (Signed)
Before [facility] Ask any questions about feeding, diapering, and baby care before you leave the hospital. Ask again if you do not understand. Ask when you need to see the doctor again. There are several things you must have before your baby comes home.  Infant car seat.  Crib.  Do not let your baby sleep in a bed with you or anyone else.  If you do not have a bed for your baby, ask the doctor what you can use that will be safe for the baby to sleep in. Infant feeding supplies:  6 to 8 bottles (8 oz. size).  6 to 8 nipples.  Measuring cup.  Measuring tablespoon.  Bottle brush.  Sterilizer (or use any large pan or kettle with a lid).  Formula that contains iron.  A way to boil and cool water. Breastfeeding supplies:  Breast pump.  Nipple cream. Clothing:  24 to 36 cloth diapers and waterproof diaper covers or a box of disposable diapers. You may need as many as 10 to 12 diapers per day.  3 onesies (other clothing will depend on the time of year and the weather).  3 receiving blankets.  3 baby pajamas or gowns.  3 bibs. Bath equipment:  Mild soap.  Petroleum jelly. No baby oil or powder.  Soft cloth towel and wash cloth.  Cotton balls.  Separate bath basin for baby. Only sponge bathe until umbilical cord and circumcision are healed. Other supplies:  Thermometer and bulb syringe (ask the hospital to send them home with you). Ask your doctor about how you should take your baby's temperature.  One to two pacifiers. Prepare for an emergency:  Know how to get to the hospital and know where to admit your baby.  Put all doctor numbers near your house phone and in your cell phone if you have one. Prepare your family:  Talk with siblings about the baby coming home and how they feel about it.  Decide how you want to handle visitors and other family members.  Take offers for help with the baby. You will need time to adjust. Know when to call the doctor.   GET HELP RIGHT AWAY IF:  Your baby's temperature is greater than 100.4 F (38 C).  The softspot on your baby's head starts to bulge.  Your baby is crying with no tears or has no wet diapers for 6 hours.  Your baby has rapid breathing.  Your baby is not as alert. Document Released: 09/08/2008 Document Revised: 12/19/2011 Document Reviewed: 12/16/2010 [facility] Patient Information 2014 [place], [place]. Discharge instructions   You can wash your hair  Shower  Eat what you want  Drink what you want  See me in 6 weeks  Your ankles are going to swell more in the next 2 weeks than when pregnant  No sex for 6 weeks   Kathreen Cosier, MD 03/17/2014

## 2014-03-17 NOTE — Discharge Summary (Signed)
Obstetric Discharge Summary Reason for Admission: onset of labor Prenatal Procedures: none Intrapartum Procedures: spontaneous vaginal delivery Postpartum Procedures: none Complications-Operative and Postpartum: none Hemoglobin  Date Value Ref Range Status  03/16/2014 8.5* 12.0 - 15.0 g/dL Final     REPEATED TO VERIFY     DELTA CHECK NOTED     HCT  Date Value Ref Range Status  03/16/2014 26.3* 36.0 - 46.0 % Final    Physical Exam:  General: alert Lochia: appropriate Uterine Fundus: firm Incision: healing well DVT Evaluation: No evidence of DVT seen on physical exam.  Discharge Diagnoses: Term Pregnancy-delivered  Discharge Information: Date: 03/17/2014 Activity: pelvic rest Diet: routine Medications: Percocet Condition: stable Instructions: refer to practice specific booklet Discharge to: home Follow-up Information   Follow up with Antionette Char A, MD. Schedule an appointment as soon as possible for a visit in 2 weeks.   Specialty:  Obstetrics and Gynecology   Contact information:   [address] [place] [address] [phone]       Follow up with Kathreen Cosier, MD.   Specialty:  Obstetrics and Gynecology   Contact information:   [address] [place] [address] [phone]       Newborn Data: Live born female  Birth Weight: 6 lb 1 oz (2750 g) APGAR: 8, 9  Home with mother.  Kathreen Cosier 03/17/2014, 7:10 AM

## 2014-03-17 NOTE — Lactation Note (Signed)
This note was copied from the chart of Carol Williamson. Lactation Consultation Note  Patient Name: Carol Williamson Date: 03/17/2014 Reason for consult: Follow-up assessment Per mom breast are filling - and baby just fed for 30 mins,  LC reviewed sore nipple and engorgement prevention and tx if needed. Mom already has  Hand pump at [facility] and per mom comfort able with the #24 flange , also has a DEBP at home. Baby's weight loss is only 3%, < 6 pounds, LC discussed with mom the importance of always softening the 1st  breast prior to offering the 2nd breast, if the baby doesn't latch the 2nd breast to release it down with pumping. Discussed the importance of establishing and protecting milk supply. Mom also aware of the Baby and me book as a resource. Mother informed of post-discharge support and given phone number to the lactation department, including services for phone call assistance; out-patient appointments; and breastfeeding support group. List of other breastfeeding resources in the community given in the handout. Encouraged mother to call for problems or concerns related to breastfeeding.   Maternal Data Formula Feeding for Exclusion: No  Feeding Feeding Type:  (per mom recently fed for 30 mins ) Length of feed: 30 min (per mom )  LATCH Score/Interventions          Comfort (Breast/Nipple):  (per mom breast are filling )     Intervention(s): Breastfeeding basics reviewed     Lactation Tools Discussed/Used Tools: Pump (per mom will have a DEBP at home ) Breast pump type: Manual   Consult Status Consult Status: Complete Date: 03/17/14    Kathrin Greathouse 03/17/2014, 9:19 AM

## 2014-03-18 ENCOUNTER — Encounter: Admitting: Advanced Practice Midwife

## 2014-03-18 ENCOUNTER — Other Ambulatory Visit

## 2014-03-21 ENCOUNTER — Inpatient Hospital Stay (HOSPITAL_COMMUNITY): Admission: RE | Admit: 2014-03-21 | Payer: 59 | Source: Ambulatory Visit

## 2014-03-27 ENCOUNTER — Ambulatory Visit: Admitting: Obstetrics & Gynecology

## 2014-03-28 NOTE — Progress Notes (Signed)
Post discharge chart review completed.  

## 2014-03-31 ENCOUNTER — Encounter: Payer: Self-pay | Admitting: Obstetrics & Gynecology

## 2014-03-31 ENCOUNTER — Ambulatory Visit (HOSPITAL_COMMUNITY)
Admission: RE | Admit: 2014-03-31 | Discharge: 2014-03-31 | Disposition: A | Discharge status: Home / Self Care | Source: Ambulatory Visit | Attending: Obstetrics & Gynecology | Admitting: Obstetrics & Gynecology

## 2014-03-31 ENCOUNTER — Ambulatory Visit (INDEPENDENT_AMBULATORY_CARE_PROVIDER_SITE_OTHER): Admitting: Obstetrics & Gynecology

## 2014-03-31 ENCOUNTER — Ambulatory Visit: Admitting: Obstetrics & Gynecology

## 2014-03-31 NOTE — Progress Notes (Signed)
Patient ID: Carol Williamson, female   DOB: 04/08/1991, 23 y.o.   MRN: [id number] Subjective:     Carol Williamson is a 23 y.o. female who presents for a postpartum visit. She is 2 weeks postpartum following a spontaneous vaginal delivery. I have fully reviewed the prenatal and intrapartum course. Outcome: spontaneous vaginal delivery.  Postpartum course has been uncomplicated. Baby's course has been unremarkable. Baby is feeding by breast. Bowel function is normal. Bladder function is normal. Postpartum depression screening: negative.  C/O decreased milk production from the right breast.  The following portions of the patient's history were reviewed and updated as appropriate: allergies, current medications, past family history, past medical history, past social history, past surgical history and problem list.  Review of Systems Pertinent items are noted in HPI.   Objective:    BP 118/77  Pulse 111  Temp(Src) 98.5 F (36.9 C)  Ht 5' (1.524 m)  Wt 52.164 kg (115 lb)  BMI 22.46 kg/m2  Breastfeeding? Yes          50% of 15 min visit spent on counseling and coordination of care.  Assessment:     Normal postpartum exam.  Breastfeeding problem--?clogged milk duct  Plan:    1. Contraception: considering a LARC 2.  Encouraged consultation w/breast/lactation consultant 3. Follow up in: 1 month or as needed.

## 2014-03-31 NOTE — Lactation Note (Signed)
Adult Lactation Consultation Outpatient Visit Note  Patient Name: Carol Williamson Favorite Date of Birth: 02/07/1991 Gestational Age at Delivery:term Type of Delivery: Pecola LeisureBaby is 382 weeks old  Breastfeeding History: Frequency of Breastfeeding: unable to put baby to breast since engorgement has occurred due to pain. Mother pumping instead and feeding expressed milk per bottle.  Voids: qs Stools: qs  Supplementing / Method: Pumping:  Type of Pump: Evenflow double electric purchased at a retail store   Frequency: every 3 hours  Volume:  20 oz/ day from left breast. 2-3 oz/day from right breast  Comments: Patient was sent over to the Lactation department for a consultation due to severe, pain and inability to remove milk form right breast. Right breast is hard to touch especially in the outer aspect of the breast near axillary. Nipple is flat, skin tight, red area noted at (reference to a clock) 6:00 to 9:00 and warm to touch. Patient does not have a fever [facility]. Ice packs applied to right breast for 15 minutes. Mother pumped both breast with a steady flow of milk form the left breast and drops for the right breast. Left breast yielded 120 ml with the 10 minutes and softened to mother's comfort. The right breast stayed firm and nodular in the areas as mentioned above. Patient reclined and breast massage of firm area back towards the axillary region. Ice was reapplied for 15 minutes with minimum softening. Pumping overall in that breast yielded 15-20 ml. Patient has had bilaterial nipple piercing in the past and reports when she took her ring out of the right nipple, unsure of date, that it had pus like drainage. The left nipple ring piercing removal was intact. Patient has several pores on her nipple that milk is being expressed from including the site where the piercing once was located but the the breast is not emptying.  Mother has W[employer] and [employer]. WIC  office was called on behalf of the patient arrangements for patient to pick up a hospital grade loaner pump to use to help manage engorgement and maintain milk supply from right breast. Patient to contact her insurance for options for obtaining a quality DEBP to use for duration of her plans to provide breast milk for her baby.  Plan of care given to patient to manage engorgement: Apply ice to right breast  x 15-20 minutes prior to pumping. Pump every 2-3 hours for 15-20 minutes while massaging breast. Lay back and massage firm area areas in breast towards axillary region. Apply cabbage leaves to right breast 4 times per day, inside bra until wilted, to decrease engorgement. Repeat icing and pumping 8 times a day until able to obtain relief. Instructed to call her OB doctor if no relief with pumping regime in 24 hours, increase pain, redness and/or fever.    Consultation Evaluation:  Initial Feeding Assess: Not assessed since mother is only pumping at this time. Pre-feed Weight: Post-feed Weight: Amount Transferred: Comments:  Amount Transferred: Comments:  Total Breast milk Transferred this Visit:  Total Supplement Given:   Additional Interventions:   Follow-Up  Patient call to schedule a follow up appointment if engorgement once she knows her schedule. Patient to call OB if symptoms of infection/ Medical sales representativemastistis [facility] Call [employer] to inquire about getting a hospital grade breast pump or double electric pump to maintain her supply.    Christella HartiganDaly, Beverly M 03/31/2014, 1:23 PM

## 2014-03-31 NOTE — Patient Instructions (Addendum)
Levonorgestrel intrauterine device (IUD) What is this medicine? LEVONORGESTREL IUD (LEE voe nor jes trel) is a contraceptive (birth control) device. The device is placed inside the uterus by a healthcare professional. It is used to prevent pregnancy and can also be used to treat heavy bleeding that occurs during your period. Depending on the device, it can be used for 3 to 5 years. This medicine may be used for other purposes; ask your health care provider or pharmacist if you have questions. COMMON BRAND NAME(S): Mirena, Skyla What should I tell my health care provider before I take this medicine? They need to know if you have any of these conditions: -abnormal Pap smear -cancer of the breast, uterus, or cervix -diabetes -endometritis -genital or pelvic infection now or in the past -have more than one sexual partner or your partner has more than one partner -heart disease -history of an ectopic or tubal pregnancy -immune system problems -IUD in place -liver disease or tumor -problems with blood clots or take blood-thinners -use intravenous drugs -uterus of unusual shape -vaginal bleeding that has not been explained -an unusual or allergic reaction to levonorgestrel, other hormones, silicone, or polyethylene, medicines, foods, dyes, or preservatives -pregnant or trying to get pregnant -breast-feeding How should I use this medicine? This device is placed inside the uterus by a health care professional. Talk to your pediatrician regarding the use of this medicine in children. Special care may be needed. Overdosage: If you think you have taken too much of this medicine contact a poison control center or emergency room at once. NOTE: This medicine is only for you. Do not share this medicine with others. What if I miss a dose? This does not apply. What may interact with this medicine? Do not take this medicine with any of the following  medications: -amprenavir -bosentan -fosamprenavir This medicine may also interact with the following medications: -aprepitant -barbiturate medicines for inducing sleep or treating seizures -bexarotene -griseofulvin -medicines to treat seizures like carbamazepine, ethotoin, felbamate, oxcarbazepine, phenytoin, topiramate -modafinil -pioglitazone -rifabutin -rifampin -rifapentine -some medicines to treat HIV infection like atazanavir, indinavir, lopinavir, nelfinavir, tipranavir, ritonavir -St. John's wort -warfarin This list may not describe all possible interactions. Give your health care provider a list of all the medicines, herbs, non-prescription drugs, or dietary supplements you use. Also tell them if you smoke, drink alcohol, or use illegal drugs. Some items may interact with your medicine. What should I watch for while using this medicine? Visit your doctor or health care professional for regular check ups. See your doctor if you or your partner has sexual contact with others, becomes HIV positive, or gets a sexual transmitted disease. This product does not protect you against HIV infection (AIDS) or other sexually transmitted diseases. You can check the placement of the IUD yourself by reaching up to the top of your vagina with clean fingers to feel the threads. Do not pull on the threads. It is a good habit to check placement after each menstrual period. Call your doctor right away if you feel more of the IUD than just the threads or if you cannot feel the threads at all. The IUD may come out by itself. You may become pregnant if the device comes out. If you notice that the IUD has come out use a backup birth control method like condoms and call your health care provider. Using tampons will not change the position of the IUD and are okay to use during your period. What side effects may I   notice from receiving this medicine? Side effects that you should report to your doctor or  health care professional as soon as possible: -allergic reactions like skin rash, itching or hives, swelling of the face, lips, or tongue -fever, flu-like symptoms -genital sores -high blood pressure -no menstrual period for 6 weeks during use -pain, swelling, warmth in the leg -pelvic pain or tenderness -severe or sudden headache -signs of pregnancy -stomach cramping -sudden shortness of breath -trouble with balance, talking, or walking -unusual vaginal bleeding, discharge -yellowing of the eyes or skin Side effects that usually do not require medical attention (report to your doctor or health care professional if they continue or are bothersome): -acne -breast pain -change in sex drive or performance -changes in weight -cramping, dizziness, or faintness while the device is being inserted -headache -irregular menstrual bleeding within first 3 to 6 months of use -nausea This list may not describe all possible side effects. Call your doctor for medical advice about side effects. You may report side effects to FDA at 1-800-FDA-1088. Where should I keep my medicine? This does not apply. NOTE: This sheet is a summary. It may not cover all possible information. If you have questions about this medicine, talk to your doctor, pharmacist, or health care provider.  2015, Elsevier/Gold Standard. (2011-10-27 13:54:04) Patient information: Common breastfeeding problems (Beyond the Basics)  Author Lenord CarboJeanne Spencer, MD Section Editor Edwyna ReadySteven A Abrams, MD Deputy Editors Thornton PapasMelanie S Kim, MD Hoy Mornonstanza Villalba, PhD Disclosures: Lenord CarboJeanne Spencer, MD Nothing to disclose. Edwyna ReadySteven A Abrams, MD Grant/Research/Clinical Trial Support: [employer] (infant nutrition [specialized formulas for preterm infants]). Thornton PapasMelanie S Kim, MD Employee of [employer] Hoy Mornonstanza Villalba, PhD Employee of [employer]  Contributor disclosures are reviewed for conflicts of interest by the editorial group. When found, these  are addressed by vetting through a multi-level review process, and through requirements for references to be provided to support the content. Appropriately referenced content is required of all authors and must conform to [employer] standards of evidence.  Conflict of interest policy  All topics are updated as new evidence becomes available and our peer review process is complete.  Literature review current through: Apr 2014.  This topic last updated: Feb 04, 2013.  BREASTFEEDING PROBLEMS OVERVIEW - Breastfeeding is universally recognized as the best way to feed an infant because it protects mother and infant from a variety of health problems. Even so, many women who start out breastfeeding stop before the recommended minimum of exclusive breastfeeding for six months. Often women stop because common problems interfere with their ability to breastfeed. Luckily, with sound guidance and appropriate medical treatment, most women can overcome these obstacles and continue breastfeeding for longer periods. This topic discusses common problems associated with breastfeeding and how to handle them. Other aspects of breastfeeding are discussed elsewhere. (See "Patient information: Deciding to breastfeed (Beyond the Basics)" and "Patient information: Breastfeeding guide (Beyond the Basics)" and "Patient information: Maternal health and nutrition during breastfeeding (Beyond the Basics)" and "Patient information: Breast pumps (Beyond the Basics)".) INADEQUATE MILK INTAKE - The most common reason women stop breastfeeding is that they think their infant is not getting enough milk, but in many cases the mother has an adequate supply. A true inadequate supply can happen if the infant is unable to extract milk well or if the mother doesn't make enough milk. Unfortunately, figuring out if a mother has enough milk and if not, why not, can be challenging. Inadequate milk production - There are a number of reasons why a  mother  might not make enough milk, including that: ?Her breasts did not develop sufficiently during pregnancy - This can happen if she doesn't have enough milk-producing tissue (called glandular tissue) (figure 1). ?She previously had breast surgery or radiation treatment. ?She has a hormonal imbalance. ?She takes certain medications that interfere with milk production. Women who have had breast surgery, such a breast augmentation or breast reduction surgery, often have trouble making enough milk. For some, breastfeeding is impossible. If you had breast surgery, ask your healthcare provider if the type of surgery you had would totally interfere with breastfeeding. If not, or if you are unable to get complete information on your surgery, do go ahead and try, but make sure your healthcare provider closely monitors your baby's progress. Poor milk extraction - The most common reasons infants have trouble getting enough milk are: ?They do not get fed frequently enough (which can cause milk production to slow or stop) ?They cannot latch on properly (figure 2) ("A Perfect Latch" video; for a good demonstration of latch, place the timer of the video on minute 7:43) ?They are separated from their mother too much ?They are fed formula Many babies are sleepy and difficult to keep awake during the first several days after birth. This can prevent the baby from getting enough to eat. Other babies can have trouble controlling the muscles involved in suckling, which makes it hard for them to extract milk. Feeding difficulty is especially common among premature and late preterm babies. Many mothers judge adequacy of feeding by lack of crying. This can be misleading if the baby is not getting enough milk and is overly sleepy. Diagnosis of inadequate intake - Healthcare providers determine whether a baby is getting enough milk based on the following: ?Number of feeding sessions the mother reports having - During the first week  of life, mothers with term infants (meaning they are not premature) generally nurse 8 to 12 times in 24 hours. By four weeks after delivery, nursing usually decreases to seven to nine times per day. ?Amount of urine and stool the baby makes - By the fifth day of life, infants who are getting enough milk urinate six to eight times a day and have three or more stools a day. (Once a mother's milk comes in, her infant's stool should be pale yellow and seedy.) ?Weight of the baby - Term infants lose an average of 7 percent of their birth weight in the first three to five days of life. They typically get back to their birth weight within one to two weeks. Once a mother's breasts fill with milk - by day three to five - her infant should not keep losing weight. If an infant has lost 10 percent of its weight or fails to return to its birth weight when expected, healthcare providers start to explore potential problems. Household scales are not accurate enough to detect these small weight differences. If you are using a medical scale for infants, remember to weigh the infant with the same clothes and diaper before and after the feeding. Management of inadequate intake - If your healthcare provider suspects your baby is not getting enough milk, he or she will want to figure out why. To do that, the healthcare provider will ask you about your experiences breastfeeding and about your and your baby's medical history. A healthcare provider should also watch as you try to breastfeed to see if there could be something wrong with the way your baby latches on or  with the baby's mouth. If so, it will be important for you to learn how to position your baby so that the baby can latch on properly (figure 2 and figure 3). If you are having trouble with this, the healthcare provider will direct you to community resources ? often a Advertising copywriter ? for assistance. If your baby has a good latch, but you still have problems with  inadequate milk intake, your healthcare provider might suggest that you try to feed more often or try to stimulate more milk production by using a breast pump or expressing by hand (figure 4) ("Hand Expression of Breastmilk" video). There are medications called galactagogues (or lactagogues) that supposedly increase milk production, but it's unclear whether these medications actually work and whether they are safe for a nursing baby, so we do not recommend their use. NIPPLE AND BREAST PAIN - The second most common reason mothers stop breastfeeding early is nipple or breast pain. The causes of nipple and breast pain include: ?Nipple injury (caused by the baby or a breast pump) ?Engorgement, which means the breasts get overly full ?Plugged milk ducts ?Nipple and breast infections ?Excessive milk supply ?Skin disorders (such as dermatitis or psoriasis) affecting the nipple ?Nipple vasoconstriction, which means the blood vessels in the nipple tighten and do not let enough blood through Possible causes of breast or nipple pain related to the baby could include: ?Ankyloglossia (also called tongue-tie), which is when the baby's tongue cannot move as freely as it should, making it hard for the baby to suckle effectively ?Torticollis, which is when the baby's neck is twisted, making it hard for the baby to nurse from both breasts comfortably ?Birth defects in the shape of the baby's mouth that make it hard for the baby to latch on ?Uncoordinated suck, which is when the baby does not move its tongue in the correct rhythm to extract milk To determine the cause of your pain, your healthcare provider will examine you and your baby, and watch you breastfeed. He or she will also ask about your pain (when it started, what makes it better or worse), and about aspects of your health that could hold clues about the cause of your pain. The most important part of the exam takes place when the healthcare provider watches  you breastfeed. That's because most cases of breast pain in the nursing mother are due to incorrect breastfeeding technique. One common problem is that the baby is not latching on properly, and so injures the nipple, but also cannot empty the breast. This, in turn, can lead to engorgement, plugged ducts, and breast infections. Nipple pain - Sore nipples are one of the most common complaints by new mothers. Pain due to nipple injury needs to be distinguished from nipple sensitivity, which normally increases during pregnancy and peaks about four days after giving birth. You can usually tell the difference between normal nipple sensitivity and pain caused by nipple injury based on when it happens and how it changes over time. Normal sensitivity typically subsides 30 seconds after suckling begins. It also diminishes on the fourth day after giving birth and completely resolves when the baby is about a week old. Nipple pain caused by trauma, on the other hand, persists or gets worse after suckling begins. Severe pain or pain that continues after the first week after birth is more likely to be due to nipple injury. Normal nipple sensitivity - If you have some discomfort related to normal nipple sensitivity, keep in mind that  this sensitivity usually goes away after the first few suckles of a feeding, and stops happening after the first week or two of nursing. If you find the "pins and needles" sensation of milk let-down to be uncomfortable, rest assured this discomfort also resolves in the first weeks of breastfeeding. If needed, you can take acetaminophen (sample brand name: Tylenol) to ease your discomfort. Nipple injury - Nipple injury usually is due to incorrect breastfeeding technique, particularly poor position or latch-on. Other factors that can make pain caused by injury worse include harsh breast cleansing, use of potentially irritating products, and biting by an older infant. Here are some things you can do  to prevent nipple injury: ?Learn how to position your baby so that the baby can latch on properly (figure 2 and figure 3). If you are having trouble with this, get help from a healthcare provider or a Advertising copywriter. ?Try to keep your nipples dry, and allow them to air-dry after feedings. ?Do not use harsh soaps or cleansers on your breasts. ?If your baby's mouth has any abnormalities, make sure to have them addressed as soon as possible. For example, if your baby has tongue-tie, surgery to release the tongue will make it easier for the baby to latch on properly. ?If your baby is biting you, position the baby so that his or her mouth is wide open during feedings. That will make it harder to bite. Also, stick your finger between your nipple and the baby's mouth any time he or she bites you and firmly say "no." Then put the baby down in a safe place. The baby will learn not to bite you. Here are some things you can do to promote healing if your nipples are already injured: ?Always start nursing with the breast that does not have the injury. ?If your nipples are cracked or raw, put an ointment on them, such as Vitamin A and D or purified lanolin (if you are not allergic) and cover them with a nonstick pad. This will help prevent nipple infection and keep the injured part of your nipple from sticking to your bra. If you think your nipple is infected or you have a rash, see your healthcare provider. ?Use cool or warm compresses, if they seem to help. Avoid ice. ?Take a mild pain reliever, such as acetaminophen (brand name Tylenol) or ibuprofen (sample brand names: Advil, Motrin), before feeding. ?If nipple pain prevents your baby from emptying your breasts, try using a pump or hand expression to empty your breasts. This will give your nipples a chance to heal and prevent engorgement. Use the milk you remove to feed your baby. ?Do NOT use vitamin E oil on your nipples. At high levels, it could be toxic  to your baby. Nipple vasoconstriction - Nipple vasoconstriction is when the blood vessels in the nipple tighten and do not let enough blood through. Mothers with this problem can have pain, burning, or numbness in their nipples in response to cold, nursing, or injury. The nipples can also turn white or blue, and then pink when the blood returns. One way to tell nipple vasoconstriction apart from other causes of nipple pain is that it can be predictably triggered by cold, while other causes of pain cannot. To manage nipple vasoconstriction, try to keep your whole body warm and dress warmly. Also, if possible, try to breastfeed in warm conditions. It might also help to avoid nicotine and caffeine, as they can make the problem worse. Engorgement - Engorgement is  the medical term for when the breasts get too full of milk. It can make your breast feel full and firm and can cause pain and tenderness. Engorgement can sometimes impair the baby's ability to latch, which makes engorgement worse, because the baby cannot then empty the breast. Here are some things you can do to prevent and deal with engorgement: ?Learn how to position your baby so that the baby can latch on properly (figure 2 and figure 3). If you are having trouble with this, get help from a healthcare provider or a Advertising copywriter. ?If the engorgement makes it hard for your baby to latch on, manually express a small amount of milk before each feeding to soften your areola and make it easier for the baby to latch on (figure 4). To do this, place your thumb and forefingers well behind your areola (close to your chest) and then compress them together and toward your nipple in a rhythmic fashion. You can also use your hand to present your nipple in a way that is easier to latch and to help get milk out for the baby while the baby is suckling. ?You can use a breast pump to help soften your breast before a feeding, but be careful not to do it too much.  Using a pump too much will stimulate your breast to make even more milk, which will make engorgement worse. ?Apply warm compresses or take a warm shower before a feeding. This can enhance let-down and may make it easier to get milk out. ?Take a mild pain reliever, such as acetaminophen (brand name Tylenol) or ibuprofen (sample brand names: Advil, Motrin). Plugged ducts - A plugged milk duct can cause a tender or painful lump to form on the breast. If the nipple itself is plugged, a white dot or bleb can form at the end of the nipple. Things that can lead to a plugged milk duct include poor feeding technique, wearing tight clothing or an ill-fitting bra, abrupt decrease in feeding, engorgement, and infections. Here are some things you can do to prevent and deal with a plugged duct: ?Learn how to position your baby so that the baby can latch on properly (figure 2 and figure 3). If you are having trouble with this, get help from a healthcare provider or a Advertising copywriter. Make sure to vary your position during feedings so every part of the breast gets emptied. You might even try to position the baby so that his or her chin is near the plugged area, because this positioning can help drain that area best. You can also try pumping or manually expressing after feedings to improve drainage. Do not stop breastfeeding, as this could lead to engorgement and worsen the problem. ?Try using warm compresses or taking a warm shower and then manually massaging your breast from the outer part of your breast toward the nipple to advance the blockage toward the nipple or in the opposite direction to clear the duct. ?Take a mild pain reliever, such as acetaminophen (brand name Tylenol) or ibuprofen (sample brand names: Advil, Motrin). ?If your blockage does not get better within two days, see your healthcare provider, as what appears to be a blocked duct may be something more concerning. Galactoceles - Sometimes a blocked  milk duct can cause a milk-filled cyst called a galactocele to form (picture 1). Unless they are infected, galactoceles are usually painless, but they can get quite large. If necessary, a healthcare provider can drain a galactocele using a needle,  or suggest surgery if the problem is severe. BREAST INFECTIONS Lactational mastitis - Mastitis is an inflammation of the breast that is often associated with fever (which might be masked by pain medications), muscle and breast pain, and redness. It is not always caused by an infection, but most people associate it with infection. Mastitis can happen at any time during lactation, but it is most common during the first six weeks after delivery. Mastitis tends to occur if the nipples are damaged or the breasts stay engorged for too long or do not drain properly. To prevent and treat mastitis, it's important to get these problems under control. If you have any of the following symptoms, see your healthcare provider: ?A firm, red, and tender area of the breast ?Fever higher than 101?F or 38.5?C ?Muscle aches, chills, malaise, or flu-like symptoms If you do have an infection, your healthcare provider will probably put you on antibiotics. Here are some things you can do to manage mastitis: ?Take your antibiotics exactly as directed, if your healthcare provider prescribes them. If you don't start to feel better within two to three days of starting antibiotics, call your healthcare provider. You may need a different antibiotic or may have a different problem. ?Continue breastfeeding, even while you are being treated, and work on your feeding technique, so that your breasts empty well. ?Take a mild pain reliever, such as acetaminophen (sample brand name Tylenol) or ibuprofen (sample brand names: Advil, Motrin), if you think it could help. ?Apply cold compresses or ice packs. Yeast infection - Many women who are breastfeeding are diagnosed with a yeast infection of the  nipple or breast (also called candidal infection) based on their symptoms (primarily nipple pain). Even so, yeast infections of the nipple or breast are poorly understood, and researchers aren't sure what role they play in nipple pain. For now, healthcare providers often diagnose yeast infections in women who have: ?Breast pain out of proportion to any apparent cause ?A history of vaginal yeast infections or an infant with a history of yeast infections such as thrush or diaper rash ?Shiny or flaky skin on the affected nipple ?Skin scrapings from the nipple or areola that test positive for yeast (or breast milk that does) Treatments include: ?Topical antifungals - Topical antifungals are creams or gels that contain medications called antifungals, which kill yeast. Women using these agents must wipe any remaining medication they can see off their nipples before each feeding and reapply them after each feeding. Antifungal ointments are not recommended, as the paraffins they contain could be harmful to the infant. ?Gentian violet - Gentian violet 0.25 to 1% is a purple medication that you apply to your baby's mouth with a Q-tip before a feeding. After the feeding, you apply more gentian violet to any areas of your nipple and areola that are not already purple. You do this once a day for three to four days. Gentian violet is a bit messy and can sometimes irritate the baby's mouth or the nipple area, but it is effective. ?Antifungal pills - Mothers who do not get better with the options described above can be put on prescription antifungal pills. They can continue to breastfeed while on these pills, as the typical amount of drug that makes it into breast milk is safe for breastfeeding infants. BLOODY NIPPLE DISCHARGE - Some women have bloody nipple discharge during the first days to weeks of lactation. This is more common with the first pregnancy and has been called rusty pipe syndrome.  It is thought to be  caused by the increased blood flow to the breasts and ducts that happens when the mother starts making milk. The color of the milk varies from pink to red and generally resolves within a few days. Women who have bloody discharge for more than a week should be seen by a healthcare provider. MILK OVERSUPPLY - Some mothers make too much milk, which paradoxically can make breastfeeding difficult. Generally the production of milk is determined by the infant's demand, but in this case the supply exceeds demand. The problem begins early in lactation and is most common among women having their first child. In women with an oversupply of milk, the rush of the milk can be so strong that it causes the infant to choke and cough and have trouble feeding, or even to bite down to clamp the nipple. Infants whose mothers make too much milk can either gain weight quickly or gain too little weight because they cannot handle the flow of milk, or because they do not get the last of the milk in the breast, which has the most calories. If you have a problem with overproduction, don't worry. The problem usually goes away on its own. But tell your healthcare provider about it, so he or she can check whether you have any hormonal imbalances or take any medications that could make the problems worse. Here are some things you can do to deal with milk oversupply: ?Nurse in an upright position - Hold your baby upright to nurse and lean back or lie on your side (figure 3 and picture 2). This will give the baby better control of the flow of milk. ?Use your fingers to reduce the flow of milk - Try putting a scissors-hold on your areola or pressing on your breast with the heel of your hand to restrict flow. ?Give baby control - Let your baby interrupt feedings, and burp him or her often. ?Pump very little or not at all - Avoid pumping, because it can stimulate even more milk production, but you can hand-pump a little at the beginning of a  feeding to relieve some of the pressure. ?Apply cold water or ice to your nipples to decrease leaking WHEN TO SEEK HELP - If you are unable to breastfeed due to engorgement, pain, or difficulty latching your infant, help is available. Talk to your obstetrical or pediatric healthcare provider, nurse, lactation consultant, or a breastfeeding counselor. (See 'Finding a lactation consultant' below.) WHERE TO GET MORE INFORMATION - Your healthcare provider is the best source of information for questions and concerns related to your medical problem.

## 2014-04-02 ENCOUNTER — Encounter (HOSPITAL_COMMUNITY): Payer: Self-pay | Admitting: *Deleted

## 2014-04-02 ENCOUNTER — Encounter: Payer: Self-pay | Admitting: Obstetrics & Gynecology

## 2014-04-02 ENCOUNTER — Ambulatory Visit
Admission: RE | Admit: 2014-04-02 | Discharge: 2014-04-02 | Disposition: A | Discharge status: Home / Self Care | Source: Ambulatory Visit | Attending: Medical | Admitting: Medical

## 2014-04-02 ENCOUNTER — Ambulatory Visit (INDEPENDENT_AMBULATORY_CARE_PROVIDER_SITE_OTHER): Admitting: Obstetrics & Gynecology

## 2014-04-02 ENCOUNTER — Inpatient Hospital Stay (HOSPITAL_COMMUNITY)
Admission: AD | Admit: 2014-04-02 | Discharge: 2014-04-02 | Disposition: A | Discharge status: Home / Self Care | Source: Ambulatory Visit | Attending: Obstetrics | Admitting: Obstetrics

## 2014-04-02 VITALS — BP 108/71 | HR 116 | Temp 98.7°F | Ht 60.0 in | Wt 115.0 lb

## 2014-04-02 DIAGNOSIS — N63 Unspecified lump in unspecified breast: Secondary | ICD-10-CM | POA: Diagnosis not present

## 2014-04-02 DIAGNOSIS — N644 Mastodynia: Secondary | ICD-10-CM | POA: Insufficient documentation

## 2014-04-02 DIAGNOSIS — N611 Abscess of the breast and nipple: Secondary | ICD-10-CM

## 2014-04-02 DIAGNOSIS — O9122 Nonpurulent mastitis associated with the puerperium: Secondary | ICD-10-CM

## 2014-04-02 DIAGNOSIS — O9112 Abscess of breast associated with the puerperium: Secondary | ICD-10-CM | POA: Insufficient documentation

## 2014-04-02 MED ORDER — AMOXICILLIN-POT CLAVULANATE 875-125 MG PO TABS: 1.0000 | ORAL_TABLET | Freq: Two times a day (BID) | ORAL | Status: DC

## 2014-04-02 NOTE — MAU Note (Signed)
Vag del on 6/5, pt is pumping, saw lactation consultant 2 days ago, has never had much milk output from R breast & now it appears thick & yellow.  Lactation consultant told pt it could be infection.  Pt states R breast is very tight & red.

## 2014-04-02 NOTE — Discharge Instructions (Signed)
Abscess °An abscess (boil or furuncle) is an infected area on or under the skin. This area is filled with yellowish-white fluid (pus) and other material (debris). °HOME CARE  °· Only take medicines as told by your doctor. °· If you were given antibiotic medicine, take it as directed. Finish the medicine even if you start to feel better. °· If gauze is used, follow your doctor's directions for changing the gauze. °· To avoid spreading the infection: °¨ Keep your abscess covered with a bandage. °¨ Wash your hands well. °¨ Do not share personal care items, towels, or whirlpools with others. °¨ Avoid skin contact with others. °· Keep your skin and clothes clean around the abscess. °· Keep all doctor visits as told. °GET HELP RIGHT AWAY IF:  °· You have more pain, puffiness (swelling), or redness in the wound site. °· You have more fluid or blood coming from the wound site. °· You have muscle aches, chills, or you feel sick. °· You have a fever. °MAKE SURE YOU:  °· Understand these instructions. °· Will watch your condition. °· Will get help right away if you are not doing well or get worse. °Document Released: 03/14/2008 Document Revised: 03/27/2012 Document Reviewed: 12/09/2011 °ExitCare® Patient Information ©2015 [employer]. This information is not intended to replace advice given to you by your health care provider. Make sure you discuss any questions you have with your health care provider. ° °

## 2014-04-02 NOTE — MAU Note (Signed)
R breast hard to touch @ 11:00, L breast softer but large red area noted @ 3:00

## 2014-04-02 NOTE — MAU Provider Note (Signed)
History     CSN: [id number]  Arrival date and time: 04/02/14 1053   First Carol Williamson Initiated Contact with Patient 04/02/14 1124      Chief Complaint  Patient presents with  . Breast Pain   HPI [name] is a 23 y.o. G1P1001 who is PP from a SVD on 03/15/14. Carol patient states that breast pain began ~ 5 days PP and has progressively worsened. She was seen in Carol office for her PP visit on 03/31/14 and sent to lactation for consult. Per patient, lactation thought it was a possible clogged milk duct. She was told by them to follow-up with a Carol Williamson immediately. She called Carol office and was unable to get an appointment today. She states that she has had redness and swelling and tender to touch. She has noted a Williamson, thick milk from Carol right breast. She states some redness has started on Carol left breast as well that is non-tender. She denies fever.   OB History   Grav Para Term Preterm Abortions TAB SAB Ect Mult Living   1 1 1       1       Past Medical History  Diagnosis Date  . Medical history non-contributory     Past Surgical History  Procedure Laterality Date  . Hernia repair      Family History  Problem Relation Age of Onset  . Hypertension Father   . Diabetes Father     History  Substance Use Topics  . Smoking status: Never Smoker   . Smokeless tobacco: Never Used  . Alcohol Use: No    Allergies: No Known Allergies  Prescriptions prior to admission  Medication Sig Dispense Refill  . ibuprofen (ADVIL,MOTRIN) 600 MG tablet Take 1 tablet (600 mg total) by mouth every 6 (six) hours as needed.  30 tablet  5  . oxyCODONE-acetaminophen (PERCOCET/ROXICET) 5-325 MG per tablet Take 1-2 tablets by mouth every 4 (four) hours as needed for severe pain (moderate - severe pain).  40 tablet  0  . Pediatric Multiple Vit-C-FA (FLINSTONES GUMMIES OMEGA-3 DHA PO) Take 2 each by mouth daily.         Review of Systems  Constitutional: Negative for fever and  malaise/fatigue.  Gastrointestinal: Negative for abdominal pain.   Physical Exam   Blood pressure 116/67, pulse 133, temperature 98 F (36.7 C), temperature source Oral, resp. rate 18, currently breastfeeding.  Physical Exam  Constitutional: She is oriented to person, place, and time. She appears well-developed and well-nourished. No distress.  HENT:  Head: Normocephalic and atraumatic.  Cardiovascular: Tachycardia present.   Respiratory: Effort normal.  Genitourinary: There is breast swelling and tenderness. No breast discharge.  Diffuse erythema and edema of Carol right breast with firm nodule noted at 10 o'clock. Mild erythema without edema noted at 2 o'clock on Carol left breast. No active drainage from Carol sites or nipples bilaterally.   Neurological: She is alert and oriented to person, place, and time.  Skin: Skin is warm and dry. No erythema.  Psychiatric: She has a normal mood and affect.   [facility]  04/02/2014   CLINICAL DATA:  Bilateral breast pain and engorgement, with focal skin erythema. Carol patient is 2 weeks postpartum and currently expressing breast milk. No fevers or other systemic symptoms at this time.  EXAM: ULTRASOUND OF Carol right and left BREAST  COMPARISON:  None  FINDINGS: On physical exam, Carol breasts are engorged and erythematous bilaterally.  Ultrasound is performed, showing diffuse bilateral duct ectasia and engorgement, without focal collection to suggest abscess, most prominent in Carol right breast 2 o'clock and 10 o'clock regions and left breast 3 o'clock location, respectively.  IMPRESSION: Diffuse right and left breast duct ectasia is identified, without focal collection to suggest abscess, most prominent in Carol right breast 2 o'clock and 10 o'clock region and left breast 3 o'clock location, respectively. Carol presence of skin erythema suggests cellulitis, although there may be developing mastitis without currently drainable fluid collection.   RECOMMENDATION: Clinical followup. I discussed Carol need for close clinical followup and probable antibiotics with Carol patient and called these findings to Carol Williamson, Carol nurse in Dr. Marcia Williamson's office, who arranged for a follow-up appointment today at 4:45 p.m. Carol patient is encouraged to keep her follow-up appointment and to notify her physician if she develops any systemic evidence of infection. No evidence for malignancy.  Screening mammogram at age 740 unless there are persistent or intervening clinical concerns. (Code:SM-B-40A)  I have discussed Carol findings and recommendations with Carol patient. Results were also provided in writing at Carol conclusion of Carol visit. If applicable, a reminder letter will be sent to Carol patient regarding Carol next appointment.  BI-RADS CATEGORY  2: Benign Finding(s)   Electronically Signed   By: Carol PellantGretchen  Williamson M.D.   On: 04/02/2014 14:03   [facility]  04/02/2014   CLINICAL DATA:  Bilateral breast pain and engorgement, with focal skin erythema. Carol patient is 2 weeks postpartum and currently expressing breast milk. No fevers or other systemic symptoms at this time.  EXAM: ULTRASOUND OF Carol right and left BREAST  COMPARISON:  None  FINDINGS: On physical exam, Carol breasts are engorged and erythematous bilaterally.  Ultrasound is performed, showing diffuse bilateral duct ectasia and engorgement, without focal collection to suggest abscess, most prominent in Carol right breast 2 o'clock and 10 o'clock regions and left breast 3 o'clock location, respectively.  IMPRESSION: Diffuse right and left breast duct ectasia is identified, without focal collection to suggest abscess, most prominent in Carol right breast 2 o'clock and 10 o'clock region and left breast 3 o'clock location, respectively. Carol presence of skin erythema suggests cellulitis, although there may be developing mastitis without currently drainable fluid collection.  RECOMMENDATION: Clinical followup. I  discussed Carol need for close clinical followup and probable antibiotics with Carol patient and called these findings to Carol Williamson, Carol nurse in Dr. Marcia Williamson's office, who arranged for a follow-up appointment today at 4:45 p.m. Carol patient is encouraged to keep her follow-up appointment and to notify her physician if she develops any systemic evidence of infection. No evidence for malignancy.  Screening mammogram at age 23 unless there are persistent or intervening clinical concerns. (Code:SM-B-40A)  I have discussed Carol findings and recommendations with Carol patient. Results were also provided in writing at Carol conclusion of Carol visit. If applicable, a reminder letter will be sent to Carol patient regarding Carol next appointment.  BI-RADS CATEGORY  2: Benign Finding(s)   Electronically Signed   By: Carol PellantGretchen  Williamson M.D.   On: 04/02/2014 14:03    Carol Williamson Course  Procedures  MDM Discussed with Dr. Tamela Williamson. Recommends US of right breast to rule ou possible abcess [facility] needs to be performed at Carol Williamson Called Carol Williamson. US scheduled for today at 1:00 pm Per US report patient results were called to Carol office and patient will be seen by  Carol Williamson today at 4:45 pm  Assessment and Plan  A: Cellulitis Left breast duct ectasia  P: Discharge home Patient advised to follow-up as indicated by Carol Williamson Patient may return to Carol Williamson as needed or if her condition were to change or worsen   Freddi Starr, PA-C  04/02/2014, 11:24 AM

## 2014-04-03 ENCOUNTER — Other Ambulatory Visit

## 2014-04-03 NOTE — Progress Notes (Signed)
  Patient ID: Carol Fishmanlesha M Andersson, female   DOB: 02/25/1991, 23 y.o.   MRN: [id number]  Chief Complaint  Patient presents with  . Postpartum Care    Bilateral breast pain     HPI Carol Williamson is a 23 y.o. female.  She presented several days ago with symptoms c/w with a right-sided clogged milk duct.  She presented to MAU earlier today with worsening redness, engorgement.  Bilateral breast ultrasound did not show abscesses. She reports associated green-colored drainage from the right breast.   She denies flu-like symptoms.  HPI  Past Medical History  Diagnosis Date  . Medical history non-contributory     Past Surgical History  Procedure Laterality Date  . Hernia repair      Family History  Problem Relation Age of Onset  . Hypertension Father   . Diabetes Father     Social History History  Substance Use Topics  . Smoking status: Never Smoker   . Smokeless tobacco: Never Used  . Alcohol Use: No    No Known Allergies  Current Outpatient Prescriptions  Medication Sig Dispense Refill  . ibuprofen (ADVIL,MOTRIN) 600 MG tablet Take 1 tablet (600 mg total) by mouth every 6 (six) hours as needed.  30 tablet  5  . Pediatric Multiple Vit-C-FA (FLINSTONES GUMMIES OMEGA-3 DHA PO) Take 2 each by mouth daily.       Marland Kitchen. amoxicillin-clavulanate (AUGMENTIN) 875-125 MG per tablet Take 1 tablet by mouth 2 (two) times daily. For 14 days  28 tablet  0  . oxyCODONE-acetaminophen (PERCOCET/ROXICET) 5-325 MG per tablet Take 1-2 tablets by mouth every 4 (four) hours as needed for severe pain (moderate - severe pain).  40 tablet  0   No current facility-administered medications for this visit.    Review of Systems Review of Systems Constitutional: negative for fatigue, fever and weight loss Respiratory: negative for cough and wheezing Cardiovascular: negative for chest pain, fatigue and palpitations Gastrointestinal: negative for abdominal pain and change in bowel  habits Genitourinary:negative Integument/breast: positvie for nipple discharge, pain, redness Musculoskeletal:negative for myalgias Neurological: negative for gait problems and tremors Behavioral/Psych: negative for abusive relationship, depression Endocrine: negative for temperature intolerance     Blood pressure 108/71, pulse 116, temperature 98.7 F (37.1 C), height 5' (1.524 m), weight 52.164 kg (115 lb), currently breastfeeding.  Physical Exam Physical Exam  Pulmonary/Chest:     General:   alert  Breasts:   firm, bilateral erythema R> L  Abdomen:  normal findings: no organomegaly, soft, non-tender and no hernia      Data Reviewed Ultrasound--breast  Assessment    Puerperal mastitis     Plan    Meds ordered this encounter  Medications  . amoxicillin-clavulanate (AUGMENTIN) 875-125 MG per tablet    Sig: Take 1 tablet by mouth 2 (two) times daily. For 14 days    Dispense:  28 tablet    Refill:  0  Continue to express milk Follow up as needed or in 2 days     JACKSON-MOORE,LISA A 04/03/2014, 10:31 PM

## 2014-04-03 NOTE — Patient Instructions (Signed)
Breastfeeding and Mastitis °Mastitis is inflammation of the breast tissue. It can occur in women who are breastfeeding. This can make breastfeeding painful. Mastitis will sometimes go away on its own. Your health care Carol Williamson will help determine if treatment is needed. °CAUSES °Mastitis is often associated with a blocked milk (lactiferous) duct. This can happen when too much milk builds up in the breast. Causes of excess milk in the breast can include: °· Poor latch-on. If your baby is not latched onto the breast properly, she or he may not empty your breast completely while breastfeeding. °· Allowing too much time to pass between feedings. °· Wearing a bra or other clothing that is too tight. This puts extra pressure on the lactiferous ducts so milk does not flow through them as it should. °Mastitis can also be caused by a bacterial infection. Bacteria may enter the breast tissue through cuts or openings in the skin. In women who are breastfeeding, this may occur because of cracked or irritated skin. Cracks in the skin are often caused when your baby does not latch on properly to the breast. °SIGNS AND SYMPTOMS °· Swelling, redness, tenderness, and pain in an area of the breast. °· Swelling of the glands under the arm on the same side. °· Fever may or may not accompany mastitis. °If an infection is allowed to progress, a collection of pus (abscess) may develop. °DIAGNOSIS  °Your health care Carol Williamson can usually diagnose mastitis based on your symptoms and a physical exam. Tests may be done to help confirm the diagnosis. These may include: °· Removal of pus from the breast by applying pressure to the area. This pus can be examined in the lab to determine which bacteria are present. If an abscess has developed, the fluid in the abscess can be removed with a needle. This can also be used to confirm the diagnosis and determine the bacteria present. In most cases, pus will not be present. °· Blood tests to determine if  your body is fighting a bacterial infection. °· Mammogram or ultrasound tests to rule out other problems or diseases. °TREATMENT  °Mastitis that occurs with breastfeeding will sometimes go away on its own. Your health care Carol Williamson may choose to wait 24 hours after first seeing you to decide whether a prescription medicine is needed. If your symptoms are worse after 24 hours, your health care Carol Williamson will likely prescribe an antibiotic medicine to treat the mastitis. He or she will determine which bacteria are most likely causing the infection and will then select an appropriate antibiotic medicine. This is sometimes changed based on the results of tests performed to identify the bacteria, or if there is no response to the antibiotic medicine selected. Antibiotic medicines are usually given by mouth. You may also be given medicine for pain. °HOME CARE INSTRUCTIONS °· Only take over-the-counter or prescription medicines for pain, fever, or discomfort as directed by your health care Carol Williamson. °· If your health care Carol Williamson prescribed an antibiotic medicine, take the medicine as directed. Make sure you finish it even if you start to feel better. °· Do not wear a tight or underwire bra. Wear a soft, supportive bra. °· Increase your fluid intake, especially if you have a fever. °· Continue to empty the breast. Your health care Carol Williamson can tell you whether this milk is safe for your infant or needs to be thrown out. You may be told to stop nursing until your health care Carol Williamson thinks it is safe for your baby.   Use a breast pump if you are advised to stop nursing. °· Keep your nipples clean and dry. °· Empty the first breast completely before going to the other breast. If your baby is not emptying your breasts completely for some reason, use a breast pump to empty your breasts. °· If you go back to work, pump your breasts while at work to stay in time with your nursing schedule. °· Avoid allowing your breasts to become  overly filled with milk (engorged). °SEEK MEDICAL CARE IF: °· You have pus-like discharge from the breast. °· Your symptoms do not improve with the treatment prescribed by your health care Carol Williamson within 2 days. °SEEK IMMEDIATE MEDICAL CARE IF: °· Your pain and swelling are getting worse. °· You have pain that is not controlled with medicine. °· You have a red line extending from the breast toward your armpit. °· You have a fever or persistent symptoms for more than 2-3 days. °· You have a fever and your symptoms suddenly get worse. °MAKE SURE YOU:  °· Understand these instructions. °· Will watch your condition. °· Will get help right away if you are not doing well or get worse. °Document Released: 01/21/2005 Document Revised: 10/01/2013 Document Reviewed: 05/02/2013 °[employer]® Patient Information ©2015 [employer]. This information is not intended to replace advice given to you by your health care Carol Williamson. Make sure you discuss any questions you have with your health care Carol Williamson. ° °

## 2014-04-04 ENCOUNTER — Ambulatory Visit (INDEPENDENT_AMBULATORY_CARE_PROVIDER_SITE_OTHER): Admitting: Obstetrics & Gynecology

## 2014-04-04 VITALS — BP 116/81 | HR 110 | Temp 98.1°F

## 2014-04-04 DIAGNOSIS — O9122 Nonpurulent mastitis associated with the puerperium: Secondary | ICD-10-CM

## 2014-04-04 DIAGNOSIS — N61 Mastitis without abscess: Secondary | ICD-10-CM

## 2014-04-05 ENCOUNTER — Inpatient Hospital Stay (HOSPITAL_COMMUNITY)
Admission: AD | Admit: 2014-04-05 | Discharge: 2014-04-05 | Disposition: A | Discharge status: Home / Self Care | Source: Ambulatory Visit | Attending: Obstetrics | Admitting: Obstetrics

## 2014-04-05 ENCOUNTER — Encounter (HOSPITAL_COMMUNITY): Payer: Self-pay

## 2014-04-05 DIAGNOSIS — O9279 Other disorders of lactation: Secondary | ICD-10-CM | POA: Insufficient documentation

## 2014-04-05 DIAGNOSIS — O9122 Nonpurulent mastitis associated with the puerperium: Secondary | ICD-10-CM | POA: Insufficient documentation

## 2014-04-05 DIAGNOSIS — O9123 Nonpurulent mastitis associated with lactation: Secondary | ICD-10-CM

## 2014-04-05 HISTORY — DX: Mastitis without abscess: N61.0

## 2014-04-05 NOTE — MAU Note (Signed)
Pt states was seen in office Friday for mastitis. Was started on antibiotic, however was told to return here if s/s worsened. Was able to express a few drops from right breast however today is unable to do that. No fever.

## 2014-04-05 NOTE — MAU Provider Note (Signed)
CC: Mastitis     First Quaid Yeakle Initiated Contact with Patient 04/05/14 1759      HPI Carol Williamson is a 23 y.o. G1P1001 presenting at 3 weeks postpartum from SVD 03/15/2014 with worsening R>L breast pain and severe engorgement of both breasts. She is not nursing and has been able to pump from left breast only. She has been unable to get any milk from her right breast since yesterday. She had a malodorous green drainage from right breast earlier this week. She's been taking Augmentin as prescribed by Dr. Tamela Oddi for over 48 hours and Ibuprofen with last dose 1500. Denies fever, chills or flu-like sx.  She was seen by lactation consultant 5 days ago and changed to a different type of breast pump.   Past Medical History  Diagnosis Date  . Mastitis     OB History  Gravida Para Term Preterm AB SAB TAB Ectopic Multiple Living  1 1 1       1     # Outcome Date GA Lbr Len/2nd Weight Sex Delivery Anes PTL Lv  1 TRM 03/15/14 [redacted]w[redacted]d 27:03 / 00:20 2.75 kg (6 lb 1 oz) F SVD EPI  Y      Past Surgical History  Procedure Laterality Date  . Hernia repair      History   Social History  . Marital Status: Single    Spouse Name: N/A    Number of Children: N/A  . Years of Education: N/A   Occupational History  . Not on file.   Social History Main Topics  . Smoking status: Never Smoker   . Smokeless tobacco: Never Used  . Alcohol Use: No  . Drug Use: No  . Sexual Activity: Not Currently    Birth Control/ Protection: None   Other Topics Concern  . Not on file   Social History Narrative  . No narrative on file    No current facility-administered medications on file prior to encounter.   Current Outpatient Prescriptions on File Prior to Encounter  Medication Sig Dispense Refill  . amoxicillin-clavulanate (AUGMENTIN) 875-125 MG per tablet Take 1 tablet by mouth 2 (two) times daily. For 14 days  28 tablet  0  . ibuprofen (ADVIL,MOTRIN) 600 MG tablet Take 1 tablet (600 mg total)  by mouth every 6 (six) hours as needed.  30 tablet  5  . oxyCODONE-acetaminophen (PERCOCET/ROXICET) 5-325 MG per tablet Take 1-2 tablets by mouth every 4 (four) hours as needed for severe pain (moderate - severe pain).  40 tablet  0  . Pediatric Multiple Vit-C-FA (FLINSTONES GUMMIES OMEGA-3 DHA PO) Take 2 each by mouth daily.         No Known Allergies  ROS Pertinent items in HPI  PHYSICAL EXAM Filed Vitals:   04/05/14 1744  BP: 120/69  Pulse: 110  Temp: 98.2 F (36.8 C)  Resp: 16   General: Well nourished, well developed female in no acute distress Breasts: Right breast firm throughout, no purulence or milk on attempted expression, redness 9-11 o'clock and 3-5 o'clock   Left breast firm throughout with mild erythema l3 -6 o'clock Cardiovascular: Normal rate Respiratory: Normal effort Abdomen: Soft, nontender Back: No CVAT Extremities: No edema Neurologic: Alert and oriented Speculum exam: NEFG; vagina with physiologic discharge, no blood; cervix clean Bimanual exam: cervix closed, no CMT; uterus NSSP; no adnexal tenderness or masses   LAB RESULTS No results found for this or any previous visit (from the past 24 hour(s)).  IMAGING US Breast  Ltd Uni Left Inc Ax[facility]  04/02/2014   CLINICAL DATA:  Bilateral breast pain and engorgement, with focal skin erythema. The patient is 2 weeks postpartum and currently expressing breast milk. No fevers or other systemic symptoms at this time.  EXAM: ULTRASOUND OF THE right and left BREAST  COMPARISON:  None  FINDINGS: On physical exam, the breasts are engorged and erythematous bilaterally.  Ultrasound is performed, showing diffuse bilateral duct ectasia and engorgement, without focal collection to suggest abscess, most prominent in the right breast 2 o'clock and 10 o'clock regions and left breast 3 o'clock location, respectively.  IMPRESSION: Diffuse right and left breast duct ectasia is identified, without focal collection to suggest abscess,  most prominent in the right breast 2 o'clock and 10 o'clock region and left breast 3 o'clock location, respectively. The presence of skin erythema suggests cellulitis, although there may be developing mastitis without currently drainable fluid collection.  RECOMMENDATION: Clinical followup. I discussed the need for close clinical followup and probable antibiotics with the patient and called these findings to Sue LushAndrea, the nurse in Dr. Marcia BrashJackson-Moore's office, who arranged for a follow-up appointment today at 4:45 p.m. The patient is encouraged to keep her follow-up appointment and to notify her physician if she develops any systemic evidence of infection. No evidence for malignancy.  Screening mammogram at age 24 unless there are persistent or intervening clinical concerns. (Code:SM-B-40A)  I have discussed the findings and recommendations with the patient. Results were also provided in writing at the conclusion of the visit. If applicable, a reminder letter will be sent to the patient regarding the next appointment.  BI-RADS CATEGORY  2: Benign Finding(s)   Electronically Signed   By: Christiana PellantGretchen  Green M.D.   On: 04/02/2014 14:03   [place] [facility] Right [facility] Ax[facility]  04/02/2014   CLINICAL DATA:  Bilateral breast pain and engorgement, with focal skin erythema. The patient is 2 weeks postpartum and currently expressing breast milk. No fevers or other systemic symptoms at this time.  EXAM: ULTRASOUND OF THE right and left BREAST  COMPARISON:  None  FINDINGS: On physical exam, the breasts are engorged and erythematous bilaterally.  Ultrasound is performed, showing diffuse bilateral duct ectasia and engorgement, without focal collection to suggest abscess, most prominent in the right breast 2 o'clock and 10 o'clock regions and left breast 3 o'clock location, respectively.  IMPRESSION: Diffuse right and left breast duct ectasia is identified, without focal collection to suggest abscess, most prominent in the right breast 2  o'clock and 10 o'clock region and left breast 3 o'clock location, respectively. The presence of skin erythema suggests cellulitis, although there may be developing mastitis without currently drainable fluid collection.  RECOMMENDATION: Clinical followup. I discussed the need for close clinical followup and probable antibiotics with the patient and called these findings to Sue LushAndrea, the nurse in Dr. Marcia BrashJackson-Moore's office, who arranged for a follow-up appointment today at 4:45 p.m. The patient is encouraged to keep her follow-up appointment and to notify her physician if she develops any systemic evidence of infection. No evidence for malignancy.  Screening mammogram at age 24 unless there are persistent or intervening clinical concerns. (Code:SM-B-40A)  I have discussed the findings and recommendations with the patient. Results were also provided in writing at the conclusion of the visit. If applicable, a reminder letter will be sent to the patient regarding the next appointment.  BI-RADS CATEGORY  2: Benign Finding(s)   Electronically Signed   By: Christiana PellantGretchen  Green M.D.   On:  04/02/2014 14:03    MAU COURSE Lactation consultants saw pt: see notation C/W Dr. Clearance CootsHarper reporting more extensive erythema and engorgement despite 2+days of Augmentin . He advises continuation of Augmentin and F/U in office 04/06/2014. See LC in MAU tomorrow.    ASSESSMENT  1. Nonpurulent mastitis associated with lactation   2. Breast engorgement, postpartum complication   Concern for hx of purulence on right > culture sent Severe engorgement  PLAN Discharge home. See AVS for patient education. Continue pumping both breasts. Do cold packs, cabbbage and hand expression on right as needed Return for fever or systemic symptoms   Medication List         amoxicillin-clavulanate 875-125 MG per tablet  Commonly known as:  AUGMENTIN  Take 1 tablet by mouth 2 (two) times daily. For 14 days     FLINSTONES GUMMIES OMEGA-3 DHA PO   Take 2 each by mouth daily.     ibuprofen 600 MG tablet  Commonly known as:  ADVIL,MOTRIN  Take 1 tablet (600 mg total) by mouth every 6 (six) hours as needed.     oxyCODONE-acetaminophen 5-325 MG per tablet  Commonly known as:  PERCOCET/ROXICET  Take 1-2 tablets by mouth every 4 (four) hours as needed for severe pain (moderate - severe pain).       Follow-up Information   Follow up with Nursepractioner Mau, NP In 1 day.    Lactation Consult tomorrow   Danae OrleansDeirdre C Poe, CNM 04/05/2014 6:16 PM

## 2014-04-05 NOTE — MAU Note (Signed)
Pt states that she was dx with Mastitis on the 24th, started an antibiotic, US was done, and nothing was found. Pt states that pain is getting worse. Mainly rt breast, but she feels that it is possibly moving toward her rt breast.

## 2014-04-05 NOTE — Discharge Instructions (Signed)
Breast Engorgement  Breast engorgement is the overfilling of your breasts with breast milk. In the first few weeks after giving birth, you may experience breast engorgement. Although it is normal for your breasts to feel heavy, full, and uncomfortable within 3-5 days of giving birth, breast engorgement can make your breasts throb and feel hard, tightly stretched, warm, and tender. Engorgement peaks about the fifth day after you give birth. Breast engorgement can be easily treated and does not require you to stop breastfeeding.  CAUSES OF BREAST ENGORGEMENT Some women delay feedings because of sore or cracked nipples, which can lead to engorgement. Cracked and sore nipples often are caused by inadequate latching (when your baby's mouth attaches to your breast to breastfeed). If your baby is latched on properly, he or she should be able to breastfeed as long as needed, without causing any pain. If you do feel pain while breastfeeding, take your baby off your breast and try again. Get help from your health care provider or a lactation consultant if you continue to have pain. Other causes of engorgement include:   Improper position of your baby while breastfeeding.  Allowing too much time to pass between feedings.  Reduction in breastfeeding because you give your baby water, juice, formula, breast milk from a bottle, or a pacifier instead of breastfeeding.  Changes in your baby's feeding patterns.  Weak sucking from your baby, which causes less milk to be taken out of your breast during feedings.  Fatigue, stress, anemia.  Plugged milk ducts.  A history of breast surgery. SIGNS AND SYMPTOMS OF BREAST ENGORGEMENT If your breasts become engorged, you may experience:   Breast swelling, tenderness, warmth, redness, or throbbing.  Breast hardness and stretching of the skin around your breast.  Flattening, tightening, and hardening of your nipple.  A low-grade fever, which can be confused with a  breast infection. RECOMMENDATIONS TO EASE BREAST ENGORGEMENT Breast engorgement should improve in 24-48 hours after following these recommendations:  Breastfeed when you feel the need to reduce the fullness of your breasts or when your baby shows signs of hunger. This is called "breastfeeding on demand."  Newborns (babies younger than 4 weeks) often breastfeed every 1-3 hours during the day. You may need to awaken your baby to feed if he or she is asleep at a feeding time.  Do not allow your baby to sleep longer than 5 hours during the night without a feeding.  Pump or hand-express breast milk before breastfeeding to soften your breast, areola, and nipple.   Apply warm, moist heat (in the shower or with warm water-soaked hand towels) just before feeding or pumping, or massage your breast before or during breastfeeding. This increases circulation and helps your milk to flow.  Completely empty your breasts when breastfeeding or pumping. Afterward, wear a snug bra (nursing or regular) or tank top for 1-2 days to signal your body to slightly decrease milk production. Only wear snug bras or tank tops to treat engorgement. Tight bras typically should be avoided by breastfeeding mothers. Once engorgement is relieved, return to wearing regular, loose-fitting clothes.  Apply ice packs to your breasts to lessen the pain from engorgement and relieve swelling, unless the ice is uncomfortable for you.  Do not delay feedings. Try to relax when it is time to feed your baby. This helps to trigger your "let-down reflex," which releases milk from your breast.  Ensure your baby is latched on to your breast and positioned properly while breastfeeding.     Allow your baby to remain at your breast as long as he or she is latched on well and actively sucking. Your baby will let you know when he or she is done breastfeeding by pulling away from your breast or falling asleep.  Avoid introducing bottles or pacifiers  to your baby in the early weeks of breastfeeding. Wait to introduce these things until after resolving any breastfeeding challenges.  Try to pump your milk on the same schedule as when your baby would breastfeed if you are returning to work or away from home for an extended period.  Drink plenty of fluids to avoid dehydration, which can eventually put you at greater risk of breast engorgement.  CALL YOUR HEALTH CARE PROVIDER OR LACTATION CONSULTANT IF:   Engorgement lasts longer than 2 days, even after treatment.  You have flu-like symptoms, such as a fever, chills, or body aches.  Your breasts become increasingly red and painful. Document Released: 01/21/2005 Document Revised: 05/29/2013 Document Reviewed: 03/21/2013 [employer] Patient Information 2015 [employer]. This information is not intended to replace advice given to you by your health care provider. Make sure you discuss any questions you have with your health care provider.  

## 2014-04-05 NOTE — Consult Note (Addendum)
Mother presented to MAU with bilateral breast engorgement complaining that she has been unable to express on either side. Left side was able to express 4-5 oz last night but nothing since then. Wednesday mother was only able to pump 30 ml off right breast in 24 hours.  Milk appeared pale thick green with foul odor.   Mother has been pumping with both a Lactina and Evenflow DEBP. Mother has been pumping every 2 hours on days and evenings and hand expressing. Last night she went four hours without pumping or expressing.   Right breast is firm to hard with areas of hard knots, not compressible areola and tender to touch.   Left breast firm with hard knots.  Areola semi compressible on left. Mother has large red area on right breast and small red area on left breast, both warm to touch. Mother's nipple is very sensitive on left breast. Mother applied ice packs to both breasts 15 20 mins , softened the left , but with hard nodules still present. Right still hard and congested and areola hard and non compressible. Had mother then massage and hand express on both breasts. Unable to hand express breasts. Set up DEBP with 24 flanges.  Mother started pumping on left breast.  LC attempted to hand express on right with no success, tender to touch. Left breast started expressing blood tinged milk after pumping 30 ml for 10 ml.  Mother then pumped 40 more ml of normal colored milk. Unable to pump anything with 24 flange out of left breast. Switched to #27 flange on right and odorous pale green thick secretion pumped 2 ml.  Lactation plan for engorgement and mastitis: Rest, naps, lots of fluids, nutritious meals. If breasts are firm, apply ice for 20 min to top and bottom of breasts before pumping. 1.) Good massage and hand express. 2.) Pump both breasts for 15-20 min, massage while pumping if possible. 3.) Use left smaller 24 flange, right alternate between 27 and 24 flange. 4.)Once swelling goes down, heat  can be applied for 10 minutes with warm/moist cloths. 5.)Pump every 2 hours for next 5 hours then pump every 3 hours based on how your breasts feel. 6.) Follow up per discharge plan. And also encouraged to all MD.

## 2014-04-06 ENCOUNTER — Encounter: Payer: Self-pay | Admitting: Obstetrics & Gynecology

## 2014-04-06 DIAGNOSIS — O9122 Nonpurulent mastitis associated with the puerperium: Secondary | ICD-10-CM | POA: Insufficient documentation

## 2014-04-06 NOTE — Progress Notes (Signed)
  Patient ID: Carol Williamson, female   DOB: 02/27/1991, 23 y.o.   MRN: [id number]  Chief Complaint  Patient presents with  . Postpartum Care    Bilateral breast pain     HPI Carol Williamson is a 23 y.o. female.  She presented several days ago with symptoms c/w with a right-sided clogged milk duct.  She presented to MAU earlier today with worsening redness, engorgement.  Bilateral breast ultrasound did not show abscesses. She reports associated green-colored drainage from the right breast.   She denies flu-like symptoms.  HPI  Past Medical History  Diagnosis Date  . Mastitis     Past Surgical History  Procedure Laterality Date  . Hernia repair      Family History  Problem Relation Age of Onset  . Hypertension Father   . Diabetes Father     Social History History  Substance Use Topics  . Smoking status: Never Smoker   . Smokeless tobacco: Never Used  . Alcohol Use: No    No Known Allergies  Current Outpatient Prescriptions  Medication Sig Dispense Refill  . amoxicillin-clavulanate (AUGMENTIN) 875-125 MG per tablet Take 1 tablet by mouth 2 (two) times daily. For 14 days  28 tablet  0  . ibuprofen (ADVIL,MOTRIN) 600 MG tablet Take 1 tablet (600 mg total) by mouth every 6 (six) hours as needed.  30 tablet  5  . oxyCODONE-acetaminophen (PERCOCET/ROXICET) 5-325 MG per tablet Take 1-2 tablets by mouth every 4 (four) hours as needed for severe pain (moderate - severe pain).  40 tablet  0  . Pediatric Multiple Vit-C-FA (FLINSTONES GUMMIES OMEGA-3 DHA PO) Take 2 each by mouth daily.        No current facility-administered medications for this visit.    Review of Systems Review of Systems Constitutional: negative for fatigue, fever and weight loss Respiratory: negative for cough and wheezing Cardiovascular: negative for chest pain, fatigue and palpitations Gastrointestinal: negative for abdominal pain and change in bowel habits Genitourinary:negative Integument/breast:  positvie for nipple discharge, pain, redness Musculoskeletal:negative for myalgias Neurological: negative for gait problems and tremors Behavioral/Psych: negative for abusive relationship, depression Endocrine: negative for temperature intolerance     Blood pressure 116/81, pulse 110, temperature 98.1 F (36.7 C), currently breastfeeding.  Physical Exam Physical Exam  Pulmonary/Chest:     General:   alert  Breasts:   less erythema  Abdomen:  normal findings: no organomegaly, soft, non-tender and no hernia      Data Reviewed None  Assessment    Puerperal mastitis--improving on antibiotics    Plan    No orders of the defined types were placed in this encounter.  Continue to express milk Follow up as needed or in 1 week     JACKSON-MOORE,LISA A 04/06/2014, 9:57 AM

## 2014-04-08 LAB — WOUND CULTURE
Gram Stain: NONE SEEN
Gram Stain: NONE SEEN
Gram Stain: NONE SEEN

## 2014-04-08 LAB — CULTURE, ROUTINE-ABSCESS

## 2014-04-09 ENCOUNTER — Ambulatory Visit (INDEPENDENT_AMBULATORY_CARE_PROVIDER_SITE_OTHER): Payer: Managed Care, Other (non HMO) | Admitting: General Surgery

## 2014-04-09 ENCOUNTER — Encounter (INDEPENDENT_AMBULATORY_CARE_PROVIDER_SITE_OTHER): Payer: Self-pay | Admitting: General Surgery

## 2014-04-09 ENCOUNTER — Ambulatory Visit (INDEPENDENT_AMBULATORY_CARE_PROVIDER_SITE_OTHER): Admitting: Obstetrics & Gynecology

## 2014-04-09 ENCOUNTER — Encounter: Payer: Self-pay | Admitting: Obstetrics & Gynecology

## 2014-04-09 VITALS — BP 128/81 | HR 97 | Wt 115.0 lb

## 2014-04-09 VITALS — BP 118/70 | HR 75 | Temp 97.4°F | Ht 60.0 in | Wt 114.0 lb

## 2014-04-09 DIAGNOSIS — O9122 Nonpurulent mastitis associated with the puerperium: Secondary | ICD-10-CM

## 2014-04-09 MED ORDER — DOXYCYCLINE HYCLATE 100 MG PO TABS: 100.0000 mg | ORAL_TABLET | Freq: Two times a day (BID) | ORAL | Status: DC

## 2014-04-09 NOTE — Progress Notes (Signed)
Patient ID: Cedric Fishmanlesha M Caffey, female   DOB: 04/20/1991, 23 y.o.   MRN: [id number]  Chief Complaint  Patient presents with  . Follow-up    mastitis    HPI Cedric Fishmanlesha M Stehlik is a 23 y.o. female.  C/O green-tinged milk expressed from right breast.  There is an area on the left breast with purulent drainage.  She presented to the MAU several days ago with worsening pain.  A culture of the breast milk showed few MRSA. HPI  Past Medical History  Diagnosis Date  . Mastitis     Past Surgical History  Procedure Laterality Date  . Hernia repair      Family History  Problem Relation Age of Onset  . Hypertension Father   . Diabetes Father     Social History History  Substance Use Topics  . Smoking status: Never Smoker   . Smokeless tobacco: Never Used  . Alcohol Use: No    No Known Allergies  Current Outpatient Prescriptions  Medication Sig Dispense Refill  . amoxicillin-clavulanate (AUGMENTIN) 875-125 MG per tablet Take 1 tablet by mouth 2 (two) times daily. For 14 days  28 tablet  0  . ibuprofen (ADVIL,MOTRIN) 600 MG tablet Take 1 tablet (600 mg total) by mouth every 6 (six) hours as needed.  30 tablet  5  . oxyCODONE-acetaminophen (PERCOCET/ROXICET) 5-325 MG per tablet Take 1-2 tablets by mouth every 4 (four) hours as needed for severe pain (moderate - severe pain).  40 tablet  0  . Pediatric Multiple Vit-C-FA (FLINSTONES GUMMIES OMEGA-3 DHA PO) Take 2 each by mouth daily.        No current facility-administered medications for this visit.    Review of Systems Review of Systems Constitutional: negative for fatigue and weight loss Respiratory: negative for cough and wheezing Cardiovascular: negative for chest pain, fatigue and palpitations Gastrointestinal: negative for abdominal pain and change in bowel habits Genitourinary:negative Integument/breast: breast pain, pus draining from left breast Musculoskeletal:negative for myalgias Neurological: negative for gait problems and  tremors Behavioral/Psych: negative for abusive relationship, depression Endocrine: negative for temperature intolerance     Blood pressure 128/81, pulse 97, weight 52.164 kg (115 lb), currently breastfeeding.  Physical Exam Physical Exam  Pulmonary/Chest:     General:   alert  Skin:   no rash or abnormalities  Lungs:   clear to auscultation bilaterally  Heart:   regular rate and rhythm, S1, S2 normal, no murmur, click, rub or gallop  Breasts:   right breast less erythema, engorged; left breast pinpoint area with purulent drainage, engorged  Abdomen:  normal findings: no organomegaly, soft, non-tender and no hernia       Data Reviewed labs  Assessment  Puerperal mastitis, possible breast abscess Culture of the milk with few MRSA    Plan     Referral-->General surgery F/U in a few weeks         JACKSON-MOORE,Tekesha Almgren A 04/09/2014, 10:04 AM

## 2014-04-09 NOTE — Patient Instructions (Signed)
Start antibiotic tonight

## 2014-04-09 NOTE — Progress Notes (Signed)
Patient ID: Carol Williamson, female   DOB: 04/21/1991, 23 y.o.   MRN: [id number]  Chief Complaint  Patient presents with  . eval left breast    HPI Carol Fishmanlesha M Slauson is a 23 y.o. female.  We are asked to see the patient in consultation by Dr. Tamela OddiJackson Moore to evaluate her for mastitis. The patient is a 23 year old black female who recently gave birth. She has been breast feeding. She has developed significant redness and swelling of both breasts. She was started on Augmentin and her right side has significantly improved. On the Augmentin the left side has gotten worse. She denies any fevers or chills.  HPI  Past Medical History  Diagnosis Date  . Mastitis     Past Surgical History  Procedure Laterality Date  . Hernia repair      Family History  Problem Relation Age of Onset  . Hypertension Father   . Diabetes Father     Social History History  Substance Use Topics  . Smoking status: Never Smoker   . Smokeless tobacco: Never Used  . Alcohol Use: No    No Known Allergies  Current Outpatient Prescriptions  Medication Sig Dispense Refill  . amoxicillin-clavulanate (AUGMENTIN) 875-125 MG per tablet Take 1 tablet by mouth 2 (two) times daily. For 14 days  28 tablet  0  . ibuprofen (ADVIL,MOTRIN) 600 MG tablet Take 1 tablet (600 mg total) by mouth every 6 (six) hours as needed.  30 tablet  5  . oxyCODONE-acetaminophen (PERCOCET/ROXICET) 5-325 MG per tablet Take 1-2 tablets by mouth every 4 (four) hours as needed for severe pain (moderate - severe pain).  40 tablet  0  . Pediatric Multiple Vit-C-FA (FLINSTONES GUMMIES OMEGA-3 DHA PO) Take 2 each by mouth daily.       Marland Kitchen. doxycycline (VIBRA-TABS) 100 MG tablet Take 1 tablet (100 mg total) by mouth 2 (two) times daily.  14 tablet  2   No current facility-administered medications for this visit.    Review of Systems Review of Systems  Constitutional: Negative.   HENT: Negative.   Eyes: Negative.   Respiratory: Negative.    Cardiovascular: Negative.   Gastrointestinal: Negative.   Endocrine: Negative.   Genitourinary: Negative.   Musculoskeletal: Negative.   Skin: Negative.   Allergic/Immunologic: Negative.   Neurological: Negative.   Hematological: Negative.   Psychiatric/Behavioral: Negative.     Blood pressure 118/70, pulse 75, temperature 97.4 F (36.3 C), height 5' (1.524 m), weight 114 lb (51.71 kg), currently breastfeeding.  Physical Exam Physical Exam  Constitutional: She is oriented to person, place, and time. She appears well-developed and well-nourished.  HENT:  Head: Normocephalic and atraumatic.  Eyes: Conjunctivae and EOM are normal. Pupils are equal, round, and reactive to light.  Neck: Normal range of motion. Neck supple.  Cardiovascular: Normal rate, regular rhythm and normal heart sounds.   Pulmonary/Chest: Effort normal and breath sounds normal.  There is significant redness and firmness of the left breast. She has a small fluctuant area superiorly that has opened and drained but seems very superficial. The redness of the right breast has apparently improved significantly. She has 2 small fluctuant areas that seemed superficial in the upper and her right breast.  Abdominal: Soft. Bowel sounds are normal.  Musculoskeletal: Normal range of motion.  Lymphadenopathy:    She has no cervical adenopathy.  Neurological: She is alert and oriented to person, place, and time.  Skin: Skin is warm and dry.  Psychiatric: She has a  normal mood and affect. Her behavior is normal.    Data Reviewed As above  Assessment    The patient appears to have bilateral mastitis with a couple of small superficial abscesses. She has improved significantly on Augmentin on the right side but the left side has gotten worse. My recommendation for her was to be admitted for IV antibiotics. Unfortunately she has a newborn and is by herself and cannot be admitted to the hospital. She would prefer to change the  antibiotic and see if that improves. She understands that if he gets worse she will need to go to the emergency department to be admitted     Plan    I will start her on doxycycline tonight. We will have her return to the urgent office tomorrow to make sure she is not significantly worse        TOTH III,Kimmy Parish S 04/09/2014, 4:53 PM

## 2014-04-10 ENCOUNTER — Ambulatory Visit (INDEPENDENT_AMBULATORY_CARE_PROVIDER_SITE_OTHER): Payer: Managed Care, Other (non HMO) | Admitting: Surgery

## 2014-04-10 DIAGNOSIS — O9122 Nonpurulent mastitis associated with the puerperium: Secondary | ICD-10-CM

## 2014-04-10 MED ORDER — ONDANSETRON HCL 4 MG PO TABS: 4.0000 mg | ORAL_TABLET | Freq: Three times a day (TID) | ORAL | Status: DC | PRN

## 2014-04-10 MED ORDER — AMOXICILLIN-POT CLAVULANATE 875-125 MG PO TABS: 1.0000 | ORAL_TABLET | Freq: Two times a day (BID) | ORAL | Status: DC

## 2014-04-10 NOTE — Progress Notes (Signed)
Subjective:     Patient ID: Carol FishmanAlesha M Swift, female   DOB: 10/20/1990, 23 y.o.   MRN: [id number]  HPI She is here for a followup from urgent office yesterday for severe mastitis in both her breasts. She was changed to doxycycline yesterday. She reports she took a dose last evening and did well but then took another dose this morning and threw it up. This was on in the stomach. She actually reports only mild discomfort in both her breasts which she is managing with ibuprofen. She was able to get her new infant to take formula and is no longer breast-feeding.  Review of Systems     Objective:   Physical Exam On exam, there are firm areas of erythema on both her breasts but these are surprisingly only mildly tender. There is one area draining purulence and left breast.    Assessment:     Bilateral mastitis     Plan:     I discussed continuing the outpatient antibiotics versus possible medicine for IV antibiotics. As she is fairly comfortable, she would like to continue the outpatient treatment I feel is reasonable this point. She will definitely call our office as we can if she feels like she is worsening. If not, she will come back to urgent office on Monday to see if she is improving. I told her to continue the doxycycline unless she has emesis again. If that happens, she will switch back to Augmentin.

## 2014-04-14 ENCOUNTER — Encounter (INDEPENDENT_AMBULATORY_CARE_PROVIDER_SITE_OTHER): Payer: Self-pay | Admitting: General Surgery

## 2014-04-14 ENCOUNTER — Ambulatory Visit (INDEPENDENT_AMBULATORY_CARE_PROVIDER_SITE_OTHER): Payer: Managed Care, Other (non HMO) | Admitting: General Surgery

## 2014-04-14 ENCOUNTER — Other Ambulatory Visit (INDEPENDENT_AMBULATORY_CARE_PROVIDER_SITE_OTHER): Payer: Self-pay

## 2014-04-14 VITALS — BP 124/72 | HR 62 | Temp 98.0°F | Resp 18 | Ht 60.0 in | Wt 113.0 lb

## 2014-04-14 DIAGNOSIS — O91119 Abscess of breast associated with pregnancy, unspecified trimester: Secondary | ICD-10-CM

## 2014-04-14 DIAGNOSIS — O9122 Nonpurulent mastitis associated with the puerperium: Secondary | ICD-10-CM

## 2014-04-14 DIAGNOSIS — O9112 Abscess of breast associated with the puerperium: Secondary | ICD-10-CM

## 2014-04-14 DIAGNOSIS — N611 Abscess of the breast and nipple: Secondary | ICD-10-CM

## 2014-04-14 MED ORDER — SULFAMETHOXAZOLE-TRIMETHOPRIM 800-160 MG PO TABS: 1.0000 | ORAL_TABLET | Freq: Two times a day (BID) | ORAL | Status: DC

## 2014-04-14 NOTE — Patient Instructions (Signed)
You developed an abscess in the right breast. We were able to aspirate 14 cc of infected fluid and that should help.  The left breast appears to be healing  Fill the prescription for Bactrim that I called into your pharmacy and start that tonight or tomorrow  Warm soaks to the right breast 3 times a day  Return visit to the office in one week. We will try to arrange for you to see Dr. Carolynne Edouardoth if possible. Return to see Dr. Derrell LollingIngram otherwise.

## 2014-04-14 NOTE — Progress Notes (Addendum)
Patient ID: Carol Williamson, female   DOB: 04/24/1991, 23 y.o.   MRN: [id number] History:   this young woman developed postpartum mastitis, bilateral. She is here for followup. She saw Dr. Carolynne Edouardoth and then Dr. Magnus IvanBlackman and has been on doxycycline over the weekend. She doesn't feel worse. She states of the left breast drained and now feels better. She thinks she's going to drain from the right breast, and the right breast is still tender. She stopped breast-feeding a couple of weeks ago and stopped pumping 1 weeks ago.  Past history, family history, social history, and review of systems are documented on the chart, unchanged, and noncontributory except as described above  Exam: Young woman. No distress. Family member and infant child with her. Left breast is firm. Evidence of recent drainage in the upper breast but mastitis is resolving  there. There is no fluctuance. There is no tenderness.  no active drainage on left.. Right breast shows a fluctuant erythematous area at 12:00. After giving informed consent, the right breast  was prepped with alcohol, anesthetized with Xylocaine, and I aspirated 14 cc of grossly purulent fluid. This was sent for culture. Clean dressing placed. She tolerated this very well  Assessment: Right breast abscess Bilateral postpartum mastitis  Plan: Start Bactrim DS 1 twice a day x10 days Warm soaks 3 times daily Check culture and sensitivity Addendum: Culture shows MRSA, sensitive to TMP/SMX Return in one week. Sooner if she does not clearly improve    Angelia MouldHaywood M. Derrell LollingIngram, M.D., [facility] General and Minimally invasive Surgery Breast and Colorectal Surgery Office:   [phone] Pager:   [phone]

## 2014-04-17 LAB — WOUND CULTURE: Gram Stain: NONE SEEN

## 2014-04-21 ENCOUNTER — Encounter (INDEPENDENT_AMBULATORY_CARE_PROVIDER_SITE_OTHER): Payer: Self-pay | Admitting: General Surgery

## 2014-04-21 ENCOUNTER — Ambulatory Visit (INDEPENDENT_AMBULATORY_CARE_PROVIDER_SITE_OTHER): Payer: Managed Care, Other (non HMO) | Admitting: General Surgery

## 2014-04-21 VITALS — BP 122/74 | HR 76 | Temp 98.0°F | Ht 60.0 in | Wt 110.0 lb

## 2014-04-21 DIAGNOSIS — O9122 Nonpurulent mastitis associated with the puerperium: Secondary | ICD-10-CM

## 2014-04-21 NOTE — Progress Notes (Signed)
Subjective:     Patient ID: Carol FishmanAlesha M Williamson, female   DOB: 03/30/1991, 23 y.o.   MRN: [id number]  HPI The patient is a 23 year old black female who we have been following for mastitis. She ended up having an area in the right breast aspirated by Dr. Derrell LollingIngram. Overall she is much better. The redness has resolved. She is not running any fevers. The pain in the breasts has also resolved.  Review of Systems     Objective:   Physical Exam On exam there is no more evidence of cellulitis of either breast. There are no palpable fluctuant areas. She still has some palpable induration in the upper portion of the right breast    Assessment:     The patient has resolving mastitis of both breasts.     Plan:     At this point I would recommend that she finish her course of antibiotics. She will call us if anything worsens. Otherwise we will see her back on a when necessary basis

## 2014-04-21 NOTE — Patient Instructions (Signed)
Finish the bactrim.

## 2014-04-28 ENCOUNTER — Ambulatory Visit (INDEPENDENT_AMBULATORY_CARE_PROVIDER_SITE_OTHER): Admitting: Obstetrics & Gynecology

## 2014-04-28 ENCOUNTER — Encounter: Payer: Self-pay | Admitting: Obstetrics & Gynecology

## 2014-04-28 VITALS — BP 119/80 | HR 81 | Temp 97.8°F | Ht 60.0 in | Wt 111.0 lb

## 2014-04-28 DIAGNOSIS — Z3202 Encounter for pregnancy test, result negative: Secondary | ICD-10-CM

## 2014-04-28 DIAGNOSIS — Z01818 Encounter for other preprocedural examination: Secondary | ICD-10-CM

## 2014-04-28 DIAGNOSIS — Z30430 Encounter for insertion of intrauterine contraceptive device: Secondary | ICD-10-CM

## 2014-04-28 LAB — POCT URINE PREGNANCY: PREG TEST UR: NEGATIVE

## 2014-04-28 NOTE — Progress Notes (Signed)
Patient ID: Carol Williamson, female   DOB: 04/01/1991, 23 y.o.   MRN: [id number] Subjective:     Carol Williamson is a 23 y.o. female who presents for a postpartum visit. She is 7 weeks postpartum following a spontaneous vaginal delivery. I have fully reviewed the prenatal and intrapartum course. The delivery was at term. Outcome: spontaneous vaginal delivery. Anesthesia: epidural. Postpartum course has been complicated by mastitis. Baby's course has been unremarkable. Baby is feeding by bottle. Bleeding no bleeding. Bowel function is normal. Bladder function is normal. Patient is not sexually active. Contraception method is none. Postpartum depression screening: negative.  Tobacco, alcohol and substance abuse history reviewed.  Adult immunizations reviewed including TDAP, rubella and varicella.  The following portions of the patient's history were reviewed and updated as appropriate: allergies, current medications, past family history, past medical history, past social history, past surgical history and problem list.  Review of Systems Pertinent items are noted in HPI.   Objective:    BP 119/80  Pulse 81  Temp(Src) 97.8 F (36.6 C)  Ht 5' (1.524 m)  Wt 50.349 kg (111 lb)  BMI 21.68 kg/m2  LMP 04/15/2014  Breastfeeding? No        General:   alert  Skin:   no rash or abnormalities  Lungs:   clear to auscultation bilaterally  Heart:   regular rate and rhythm, S1, S2 normal, no murmur, click, rub or gallop  Abdomen:  normal findings: no organomegaly, soft, non-tender and no hernia  Pelvis:  External genitalia: normal general appearance Urinary system: urethral meatus normal and bladder without fullness, nontender Vaginal: normal without tenderness, induration or masses Cervix: normal appearance Adnexa: normal bimanual exam Uterus: anteverted and non-tender, normal size  IUD Insertion Procedure Note  Pre-operative Diagnosis: Request a LARC  Post-operative Diagnosis: same  Indications:  contraception  Procedure Details  Urine pregnancy test was done and result was negative.  The risks (including infection, bleeding, pain, and uterine perforation) and benefits of the procedure were explained to the patient and Written informed consent was obtained.    Cervix cleansed with Betadine. Uterus sounded to 8 cm. IUD inserted without difficulty. String visible and trimmed. Patient tolerated procedure well.  IUD Information: Mirena.  Condition: Stable  Complications: None  Plan:  The patient was advised to call for any fever or for prolonged or severe pain or bleeding. She was advised to use OTC analgesics as needed for mild to moderate pain.     Assessment:     Normal postpartum exam.  S/P Mirena IUD insertion Plan:   Orders Placed This Encounter  Procedures  . POCT urine pregnancy    Follow up as needed.  Healthy lifestyle practices reviewed

## 2014-05-02 ENCOUNTER — Encounter: Payer: Self-pay | Admitting: Obstetrics & Gynecology

## 2014-05-02 NOTE — Patient Instructions (Signed)
IUD PLACEMENT POST-PROCEDURE INSTRUCTIONS  1. You may take Ibuprofen, Aleve or Tylenol for pain if needed.  Cramping should resolve within in 24 hours.  2. You may have a small amount of spotting.  You should wear a mini pad for the next few days.  3. You may have intercourse after 24 hours.  If you using this for birth control, it is effective immediately.  4. You need to call if you have any pelvic pain, fever, heavy bleeding or foul smelling vaginal discharge.  Irregular bleeding is common the first several months after having an IUD placed. You do not need to call for this reason unless you are concerned.  5. Shower or bathe as normal   

## 2014-08-11 ENCOUNTER — Encounter: Payer: Self-pay | Admitting: Obstetrics & Gynecology

## 2014-10-06 ENCOUNTER — Encounter: Payer: Self-pay | Admitting: *Deleted

## 2014-10-07 ENCOUNTER — Encounter: Payer: Self-pay | Admitting: Obstetrics & Gynecology

## 2014-12-09 ENCOUNTER — Encounter (HOSPITAL_COMMUNITY): Payer: Self-pay | Admitting: Emergency Medicine

## 2014-12-09 DIAGNOSIS — N898 Other specified noninflammatory disorders of vagina: Secondary | ICD-10-CM | POA: Insufficient documentation

## 2014-12-09 DIAGNOSIS — N390 Urinary tract infection, site not specified: Secondary | ICD-10-CM | POA: Insufficient documentation

## 2014-12-09 DIAGNOSIS — Z3202 Encounter for pregnancy test, result negative: Secondary | ICD-10-CM | POA: Insufficient documentation

## 2014-12-09 DIAGNOSIS — R1030 Lower abdominal pain, unspecified: Secondary | ICD-10-CM | POA: Diagnosis present

## 2014-12-09 NOTE — ED Notes (Signed)
Patient with abdominal pain.  She states that she is unable to find her mirana and is fearful of it causing the pain.

## 2014-12-10 ENCOUNTER — Emergency Department (HOSPITAL_COMMUNITY)

## 2014-12-10 ENCOUNTER — Emergency Department (HOSPITAL_COMMUNITY)
Admission: EM | Admit: 2014-12-10 | Discharge: 2014-12-10 | Disposition: A | Discharge status: Home / Self Care | Attending: Emergency Medicine | Admitting: Emergency Medicine

## 2014-12-10 DIAGNOSIS — N390 Urinary tract infection, site not specified: Secondary | ICD-10-CM

## 2014-12-10 DIAGNOSIS — T8389XA Other specified complication of genitourinary prosthetic devices, implants and grafts, initial encounter: Secondary | ICD-10-CM

## 2014-12-10 DIAGNOSIS — R109 Unspecified abdominal pain: Secondary | ICD-10-CM

## 2014-12-10 LAB — BASIC METABOLIC PANEL
Anion gap: 7 (ref 5–15)
BUN: 10 mg/dL (ref 6–23)
CHLORIDE: 105 mmol/L (ref 96–112)
CO2: 25 mmol/L (ref 19–32)
CREATININE: 0.79 mg/dL (ref 0.50–1.10)
Calcium: 9 mg/dL (ref 8.4–10.5)
GFR calc Af Amer: >90 mL/min (ref >90)
GFR calc non Af Amer: >90 mL/min (ref >90)
GLUCOSE: 97 mg/dL (ref 70–99)
Potassium: 3.6 mmol/L (ref 3.5–5.1)
Sodium: 137 mmol/L (ref 135–145)

## 2014-12-10 LAB — CBC WITH DIFFERENTIAL/PLATELET
BASOS ABS: 0 10*3/uL (ref 0.0–0.1)
BASOS PCT: 0 % (ref 0–1)
EOS ABS: 0.1 10*3/uL (ref 0.0–0.7)
EOS PCT: 1 % (ref 0–5)
HCT: 39.2 % (ref 36.0–46.0)
Hemoglobin: 13.3 g/dL (ref 12.0–15.0)
Lymphocytes Relative: 30 % (ref 12–46)
Lymphs Abs: 3.3 10*3/uL (ref 0.7–4.0)
MCH: 30.9 pg (ref 26.0–34.0)
MCHC: 33.9 g/dL (ref 30.0–36.0)
MCV: 91.2 fL (ref 78.0–100.0)
Monocytes Absolute: 0.5 10*3/uL (ref 0.1–1.0)
Monocytes Relative: 5 % (ref 3–12)
Neutro Abs: 6.9 10*3/uL (ref 1.7–7.7)
Neutrophils Relative %: 64 % (ref 43–77)
PLATELETS: 357 10*3/uL (ref 150–400)
RBC: 4.3 MIL/uL (ref 3.87–5.11)
RDW: 12.6 % (ref 11.5–15.5)
WBC: 10.9 10*3/uL — ABNORMAL HIGH (ref 4.0–10.5)

## 2014-12-10 LAB — GC/CHLAMYDIA PROBE AMP (~~LOC~~) NOT AT ARMC
CHLAMYDIA, DNA PROBE: NEGATIVE
Neisseria Gonorrhea: NEGATIVE

## 2014-12-10 LAB — URINE MICROSCOPIC-ADD ON

## 2014-12-10 LAB — POC URINE PREG, ED: Preg Test, Ur: NEGATIVE

## 2014-12-10 LAB — URINALYSIS, ROUTINE W REFLEX MICROSCOPIC
Bilirubin Urine: NEGATIVE
Glucose, UA: NEGATIVE mg/dL
Hgb urine dipstick: NEGATIVE
Ketones, ur: 15 mg/dL — AB
NITRITE: NEGATIVE
Protein, ur: NEGATIVE mg/dL
SPECIFIC GRAVITY, URINE: 1.022 (ref 1.005–1.030)
Urobilinogen, UA: 1 mg/dL (ref 0.0–1.0)
pH: 7 (ref 5.0–8.0)

## 2014-12-10 LAB — WET PREP, GENITAL
Trich, Wet Prep: NONE SEEN
Yeast Wet Prep HPF POC: NONE SEEN

## 2014-12-10 MED ORDER — CEPHALEXIN 500 MG PO CAPS: 500.0000 mg | ORAL_CAPSULE | Freq: Three times a day (TID) | ORAL | Status: DC

## 2014-12-10 MED ORDER — FLUCONAZOLE 150 MG PO TABS: 150.0000 mg | ORAL_TABLET | Freq: Once | ORAL | Status: DC

## 2014-12-10 MED ORDER — CEPHALEXIN 250 MG PO CAPS
500.0000 mg | ORAL_CAPSULE | Freq: Once | ORAL | Status: AC
  Administered 2014-12-10: 500 mg via ORAL
  Filled 2014-12-10: qty 2

## 2014-12-10 NOTE — Discharge Instructions (Signed)
Your ultrasound showed the IUD is in the proper location. Please take the antibiotic as prescribed and follow-up with your gynecologist. If you develop a yeast infection while on the antibiotic, then take the Diflucan tablet 1 day after completing the course of antibiotics.  Urinary Tract Infection Urinary tract infections (UTIs) can develop anywhere along your urinary tract. Your urinary tract is your body's drainage system for removing wastes and extra water. Your urinary tract includes two kidneys, two ureters, a bladder, and a urethra. Your kidneys are a pair of bean-shaped organs. Each kidney is about the size of your fist. They are located below your ribs, one on each side of your spine. CAUSES Infections are caused by microbes, which are microscopic organisms, including fungi, viruses, and bacteria. These organisms are so small that they can only be seen through a microscope. Bacteria are the microbes that most commonly cause UTIs. SYMPTOMS  Symptoms of UTIs may vary by age and gender of the patient and by the location of the infection. Symptoms in young women typically include a frequent and intense urge to urinate and a painful, burning feeling in the bladder or urethra during urination. Older women and men are more likely to be tired, shaky, and weak and have muscle aches and abdominal pain. A fever may mean the infection is in your kidneys. Other symptoms of a kidney infection include pain in your back or sides below the ribs, nausea, and vomiting. DIAGNOSIS To diagnose a UTI, your caregiver will ask you about your symptoms. Your caregiver also will ask to provide a urine sample. The urine sample will be tested for bacteria and white blood cells. White blood cells are made by your body to help fight infection. TREATMENT  Typically, UTIs can be treated with medication. Because most UTIs are caused by a bacterial infection, they usually can be treated with the use of antibiotics. The choice of  antibiotic and length of treatment depend on your symptoms and the type of bacteria causing your infection. HOME CARE INSTRUCTIONS  If you were prescribed antibiotics, take them exactly as your caregiver instructs you. Finish the medication even if you feel better after you have only taken some of the medication.  Drink enough water and fluids to keep your urine clear or pale yellow.  Avoid caffeine, tea, and carbonated beverages. They tend to irritate your bladder.  Empty your bladder often. Avoid holding urine for long periods of time.  Empty your bladder before and after sexual intercourse.  After a bowel movement, women should cleanse from front to back. Use each tissue only once. SEEK MEDICAL CARE IF:   You have back pain.  You develop a fever.  Your symptoms do not begin to resolve within 3 days. SEEK IMMEDIATE MEDICAL CARE IF:   You have severe back pain or lower abdominal pain.  You develop chills.  You have nausea or vomiting.  You have continued burning or discomfort with urination. MAKE SURE YOU:   Understand these instructions.  Will watch your condition.  Will get help right away if you are not doing well or get worse. Document Released: 07/06/2005 Document Revised: 03/27/2012 Document Reviewed: 11/04/2011 [facility] Patient Information 2015 [employer], [place]. This information is not intended to replace advice given to you by your health care provider. Make sure you discuss any questions you have with your health care provider.  Fluconazole tablets What is this medicine? FLUCONAZOLE (floo KON na zole) is an antifungal medicine. It is used to treat certain  kinds of fungal or yeast infections. This medicine may be used for other purposes; ask your health care provider or pharmacist if you have questions. COMMON BRAND NAME(S): Diflucan What should I tell my health care provider before I take this medicine? They need to know if you have any of these  conditions: -electrolyte abnormalities -history of irregular heart beat -kidney disease -an unusual or allergic reaction to fluconazole, other azole antifungals, medicines, foods, dyes, or preservatives -pregnant or trying to get pregnant -breast-feeding How should I use this medicine? Take this medicine by mouth. Follow the directions on the prescription label. Do not take your medicine more often than directed. Talk to your pediatrician regarding the use of this medicine in children. Special care may be needed. This medicine has been used in children as young as 62 months of age. Overdosage: If you think you have taken too much of this medicine contact a poison control center or emergency room at once. NOTE: This medicine is only for you. Do not share this medicine with others. What if I miss a dose? If you miss a dose, take it as soon as you can. If it is almost time for your next dose, take only that dose. Do not take double or extra doses. What may interact with this medicine? Do not take this medicine with any of the following medications: -astemizole -certain medicines for irregular heart beat like dofetilide, dronedarone, quinidine -cisapride -erythromycin -lomitapide -other medicines that prolong the QT interval (cause an abnormal heart rhythm) -pimozide -terfenadine -thioridazine -tolvaptan -ziprasidone This medicine may also interact with the following medications: -antiviral medicines for HIV or AIDS -birth control pills -certain antibiotics like rifabutin, rifampin -certain medicines for blood pressure like amlodipine, isradipine, felodipine, hydrochlorothiazide, losartan, nifedipine -certain medicines for cancer like cyclophosphamide, vinblastine, vincristine -certain medicines for cholesterol like atorvastatin, lovastatin, fluvastatin, simvastatin -certain medicines for depression, anxiety, or psychotic disturbances like amitriptyline, midazolam, nortriptyline,  triazolam -certain medicines for diabetes like glipizide, glyburide, tolbutamide -certain medicines for pain like alfentanil, fentanyl, methadone -certain medicines for seizures like carbamazepine, phenytoin -certain medicines that treat or prevent blood clots like warfarin -halofantrine -medicines that lower your chance of fighting infection like cyclosporine, prednisone, tacrolimus -NSAIDS, medicines for pain and inflammation, like celecoxib, diclofenac, flurbiprofen, ibuprofen, meloxicam, naproxen -other medicines for fungal infections -sirolimus -theophylline -tofacitinib This list may not describe all possible interactions. Give your health care provider a list of all the medicines, herbs, non-prescription drugs, or dietary supplements you use. Also tell them if you smoke, drink alcohol, or use illegal drugs. Some items may interact with your medicine. What should I watch for while using this medicine? Visit your doctor or health care professional for regular checkups. If you are taking this medicine for a long time you may need blood work. Tell your doctor if your symptoms do not improve. Some fungal infections need many weeks or months of treatment to cure. Alcohol can increase possible damage to your liver. Avoid alcoholic drinks. If you have a vaginal infection, do not have sex until you have finished your treatment. You can wear a sanitary napkin. Do not use tampons. Wear freshly washed cotton, not synthetic, panties. What side effects may I notice from receiving this medicine? Side effects that you should report to your doctor or health care professional as soon as possible: -allergic reactions like skin rash or itching, hives, swelling of the lips, mouth, tongue, or throat -dark urine -feeling dizzy or faint -irregular heartbeat or chest pain -redness, blistering, peeling  or loosening of the skin, including inside the mouth -trouble breathing -unusual bruising or  bleeding -vomiting -yellowing of the eyes or skin Side effects that usually do not require medical attention (report to your doctor or health care professional if they continue or are bothersome): -changes in how food tastes -diarrhea -headache -stomach upset or nausea This list may not describe all possible side effects. Call your doctor for medical advice about side effects. You may report side effects to FDA at 1-800-FDA-1088. Where should I keep my medicine? Keep out of the reach of children. Store at room temperature below 30 degrees C (86 degrees F). Throw away any medicine after the expiration date. NOTE: This sheet is a summary. It may not cover all possible information. If you have questions about this medicine, talk to your doctor, pharmacist, or health care provider.  2015, [employer]. (2013-05-04 16:13:04)

## 2014-12-10 NOTE — ED Provider Notes (Signed)
CSN: [id number]     Arrival date & time 12/09/14  2052 History  This chart was scribed for Dione Booze, MD by Bronson Curb, ED Scribe. This patient was seen in room B19C/B19C and the patient's care was started at 12:25 AM.   Chief Complaint  Patient presents with  . Abdominal Pain    The history is provided by the patient. No language interpreter was used.     HPI Comments: Carol Williamson is a 24 y.o. female who presents to the Emergency Department complaining of constant, dull, lower abdominal pain for the past 2 weeks. Patient had an IUD (Mirena) placed after spontaneous vaginal delivery in the Summer of 2015, but states she checked for it in the shower and is unable to find it. Patient suspects this may be the cause of her pain. She reports the pain is worse when laying flat. No treatments tried PTA. She denies nausea, constipation, diarrhea, vaginal bleeding, vaginal discharge, dyspareunia, or urinary symptoms.    Past Medical History  Diagnosis Date  . Mastitis    Past Surgical History  Procedure Laterality Date  . Hernia repair     Family History  Problem Relation Age of Onset  . Hypertension Father   . Diabetes Father    History  Substance Use Topics  . Smoking status: Never Smoker   . Smokeless tobacco: Never Used  . Alcohol Use: Yes     Comment: occasional   OB History    Gravida Para Term Preterm AB TAB SAB Ectopic Multiple Living   Review of Systems  Gastrointestinal: Positive for abdominal pain.  All other systems reviewed and are negative.     Allergies  Review of patient's allergies indicates no known allergies.  Home Medications   Prior to Admission medications   Medication Sig Start Date End Date Taking? Authorizing Provider  levonorgestrel (MIRENA) 20 MCG/24HR IUD 1 each by Intrauterine route once.   Yes Historical Provider, MD  doxycycline (VIBRA-TABS) 100 MG tablet Take 1 tablet (100 mg total) by mouth 2 (two) times  daily. Patient not taking: Reported on 12/09/2014 04/09/14   Chevis Pretty III, MD  ibuprofen (ADVIL,MOTRIN) 600 MG tablet Take 1 tablet (600 mg total) by mouth every 6 (six) hours as needed. Patient not taking: Reported on 12/09/2014 03/17/14   [name], MD  oxyCODONE-acetaminophen (PERCOCET/ROXICET) 5-325 MG per tablet Take 1-2 tablets by mouth every 4 (four) hours as needed for severe pain (moderate - severe pain). Patient not taking: Reported on 12/09/2014 03/17/14   [name], MD   LMP  Physical Exam  Constitutional: She is oriented to person, place, and time. She appears well-developed and well-nourished. No distress.  HENT:  Head: Normocephalic and atraumatic.  Eyes: Conjunctivae and EOM are normal. Pupils are equal, round, and reactive to light.  Neck: Normal range of motion. Neck supple. No JVD present.  Cardiovascular: Normal rate, regular rhythm and normal heart sounds.   No murmur heard. Pulmonary/Chest: Effort normal. She has no wheezes. She has no rales. She exhibits no tenderness.  Abdominal: Soft. She exhibits no distension and no mass. There is tenderness. There is no rebound and no guarding.  Mild tenderness left lower abdomen without guarding.  Genitourinary:  Normal external female genitalia. Cervix is closed. Small amount of white to yellowish vaginal discharge present. IUD string not visible. On bimanual, string is not palpable. No cervical motion tenderness.  Fundus is normal size and position and nontender. No adnexal masses or tenderness.  Musculoskeletal: Normal range of motion. She exhibits no edema.  Lymphadenopathy:    She has no cervical adenopathy.  Neurological: She is alert and oriented to person, place, and time. No cranial nerve deficit. Coordination normal.  Skin: Skin is warm and dry. No rash noted.  Psychiatric: She has a normal mood and affect. Her behavior is normal. Judgment and thought content normal.  Nursing note and vitals reviewed.   ED Course   Procedures (including critical care time)  DIAGNOSTIC STUDIES: Oxygen Saturation is 100% on room air, normal by my interpretation.    COORDINATION OF CARE: At 0028 Discussed treatment plan with patient which includes pelvic exam. Patient agrees.   Labs Review Results for orders placed or performed during the hospital encounter of 12/10/14  Wet prep, genital  Result Value Ref Range   Yeast Wet Prep HPF POC NONE SEEN NONE SEEN   Trich, Wet Prep NONE SEEN NONE SEEN   Clue Cells Wet Prep HPF POC FEW (A) NONE SEEN   WBC, Wet Prep HPF POC FEW (A) NONE SEEN  Urinalysis, Routine w reflex microscopic  Result Value Ref Range   Color, Urine YELLOW YELLOW   APPearance CLOUDY (A) CLEAR   Specific Gravity, Urine 1.022 1.005 - 1.030   pH 7.0 5.0 - 8.0   Glucose, UA NEGATIVE NEGATIVE mg/dL   Hgb urine dipstick NEGATIVE NEGATIVE   Bilirubin Urine NEGATIVE NEGATIVE   Ketones, ur 15 (A) NEGATIVE mg/dL   Protein, ur NEGATIVE NEGATIVE mg/dL   Urobilinogen, UA 1.0 0.0 - 1.0 mg/dL   Nitrite NEGATIVE NEGATIVE   Leukocytes, UA SMALL (A) NEGATIVE  Basic metabolic panel  Result Value Ref Range   Sodium 137 135 - 145 mmol/L   Potassium 3.6 3.5 - 5.1 mmol/L   Chloride 105 96 - 112 mmol/L   CO2 25 19 - 32 mmol/L   Glucose, Bld 97 70 - 99 mg/dL   BUN 10 6 - 23 mg/dL   Creatinine, Ser 6.60 0.50 - 1.10 mg/dL   Calcium 9.0 8.4 - 63.0 mg/dL   GFR calc non Af Amer >90 >90 mL/min   GFR calc Af Amer >90 >90 mL/min   Anion gap 7 5 - 15  CBC with Differential  Result Value Ref Range   WBC 10.9 (H) 4.0 - 10.5 K/uL   RBC 4.30 3.87 - 5.11 MIL/uL   Hemoglobin 13.3 12.0 - 15.0 g/dL   HCT 16.0 10.9 - 32.3 %   MCV 91.2 78.0 - 100.0 fL   MCH 30.9 26.0 - 34.0 pg   MCHC 33.9 30.0 - 36.0 g/dL   RDW 55.7 32.2 - 02.5 %   Platelets 357 150 - 400 K/uL   Neutrophils Relative % 64 43 - 77 %   Neutro Abs 6.9 1.7 - 7.7 K/uL   Lymphocytes Relative 30 12 - 46 %   Lymphs Abs 3.3 0.7 - 4.0 K/uL   Monocytes Relative 5 3  - 12 %   Monocytes Absolute 0.5 0.1 - 1.0 K/uL   Eosinophils Relative 1 0 - 5 %   Eosinophils Absolute 0.1 0.0 - 0.7 K/uL   Basophils Relative 0 0 - 1 %   Basophils Absolute 0.0 0.0 - 0.1 K/uL  Urine microscopic-add on  Result Value Ref Range   Squamous Epithelial / LPF FEW (A) RARE   WBC, UA 7-10 <3 WBC/hpf   Bacteria, UA MANY (A) RARE  Urine-Other MUCOUS PRESENT   POC Urine Pregnancy, ED (do NOT order at [facility])  Result Value Ref Range   Preg Test, Ur NEGATIVE NEGATIVE   Imaging Review US Transvaginal Non-ob  12/10/2014   CLINICAL DATA:  Subacute onset of left lower quadrant abdominal pain for 3 weeks. Initial encounter.  EXAM: TRANSABDOMINAL AND TRANSVAGINAL ULTRASOUND OF PELVIS  TECHNIQUE: Both transabdominal and transvaginal ultrasound examinations of the pelvis were performed. Transabdominal technique was performed for global imaging of the pelvis including uterus, ovaries, adnexal regions, and pelvic cul-de-sac. It was necessary to proceed with endovaginal exam following the transabdominal exam to visualize the uterus and ovaries in greater detail.  COMPARISON:  None  FINDINGS: Uterus  Measurements: 8.6 x 3.9 x 6.1 cm. No fibroids or other mass visualized. The patient's intrauterine device is noted in expected position at the fundus of the uterus.  Endometrium  Thickness: 0.5 cm. Trace fluid is noted about the intrauterine device at the level of the fundus.  Right ovary  Measurements: 3.6 x 2.2 x 3.0 cm. Normal appearance/no adnexal mass.  Left ovary  Measurements: 4.1 x 2.5 x 3.1 cm. A 3.2 x 2.2 x 2.7 cm simple appearing left ovarian cyst is likely physiologic in nature.  Other findings  A moderate amount of free fluid is noted within the pelvic cul-de-sac and the right adnexa, with mild associated debris.  IMPRESSION: 1. Intrauterine device noted in expected position at the fundus of the uterus. Trace associated fluid noted about the intrauterine device at the level of the fundus. Uterus  otherwise unremarkable. 2. 3.2 cm left ovarian cyst is likely physiologic in nature. 3. Moderate amount of free fluid within the pelvic cul-de-sac and right adnexa, with mild associated debris.   Electronically Signed   By: Roanna Raider M.D.   On: 12/10/2014 03:34   US Pelvis Complete  12/10/2014   CLINICAL DATA:  Subacute onset of left lower quadrant abdominal pain for 3 weeks. Initial encounter.  EXAM: TRANSABDOMINAL AND TRANSVAGINAL ULTRASOUND OF PELVIS  TECHNIQUE: Both transabdominal and transvaginal ultrasound examinations of the pelvis were performed. Transabdominal technique was performed for global imaging of the pelvis including uterus, ovaries, adnexal regions, and pelvic cul-de-sac. It was necessary to proceed with endovaginal exam following the transabdominal exam to visualize the uterus and ovaries in greater detail.  COMPARISON:  None  FINDINGS: Uterus  Measurements: 8.6 x 3.9 x 6.1 cm. No fibroids or other mass visualized. The patient's intrauterine device is noted in expected position at the fundus of the uterus.  Endometrium  Thickness: 0.5 cm. Trace fluid is noted about the intrauterine device at the level of the fundus.  Right ovary  Measurements: 3.6 x 2.2 x 3.0 cm. Normal appearance/no adnexal mass.  Left ovary  Measurements: 4.1 x 2.5 x 3.1 cm. A 3.2 x 2.2 x 2.7 cm simple appearing left ovarian cyst is likely physiologic in nature.  Other findings  A moderate amount of free fluid is noted within the pelvic cul-de-sac and the right adnexa, with mild associated debris.  IMPRESSION: 1. Intrauterine device noted in expected position at the fundus of the uterus. Trace associated fluid noted about the intrauterine device at the level of the fundus. Uterus otherwise unremarkable. 2. 3.2 cm left ovarian cyst is likely physiologic in nature. 3. Moderate amount of free fluid within the pelvic cul-de-sac and right adnexa, with mild associated debris.   Electronically Signed   By: Roanna Raider M.D.    On: 12/10/2014 03:34  MDM   Final diagnoses:  Urinary tract infection without hematuria, site unspecified    Pelvic pain of uncertain cause. Workup in the ED shows evidence of UTI. I do not definitely see the IUD string on pelvic exam and cannot palpated. She is sent for ultrasound which confirms IUD in appropriate location. She is given reassurance regarding her IUD and is discharged with prescription for cephalexin. She states that she developed these infection last time she was put on antibiotics. She is given a prescription for fluconazole to take after completing the course of antibiotics.  I personally performed the services described in this documentation, which was scribed in my presence. The recorded information has been reviewed and is accurate.      Dione Boozeavid Tayjah Lobdell, MD 12/10/14 [phone]

## 2014-12-10 NOTE — ED Notes (Signed)
MD at bedside. 

## 2014-12-11 LAB — HIV ANTIBODY (ROUTINE TESTING W REFLEX): HIV Screen 4th Generation wRfx: NONREACTIVE

## 2014-12-12 LAB — URINE CULTURE

## 2019-09-12 ENCOUNTER — Encounter (HOSPITAL_BASED_OUTPATIENT_CLINIC_OR_DEPARTMENT_OTHER): Payer: Self-pay | Admitting: *Deleted

## 2019-09-12 ENCOUNTER — Other Ambulatory Visit: Payer: Self-pay

## 2019-09-16 ENCOUNTER — Other Ambulatory Visit (HOSPITAL_COMMUNITY)
Admission: RE | Admit: 2019-09-16 | Discharge: 2019-09-16 | Disposition: A | Discharge status: Home / Self Care | Payer: 59 | Source: Ambulatory Visit | Attending: Orthopaedic Surgery | Admitting: Orthopaedic Surgery

## 2019-09-16 DIAGNOSIS — Z20828 Contact with and (suspected) exposure to other viral communicable diseases: Secondary | ICD-10-CM | POA: Insufficient documentation

## 2019-09-16 DIAGNOSIS — Z01812 Encounter for preprocedural laboratory examination: Secondary | ICD-10-CM | POA: Diagnosis present

## 2019-09-16 NOTE — H&P (Signed)
PREOPERATIVE H&P  Chief Complaint: MEDIAL MENISCUS TEAR LEFT KNEE  HPI: Carol Williamson is a 28 y.o. female who presents for preoperative history and physical prior to scheduled surgery, LEFT KNEE ARTHROSCOPY WITH MEDIAL MENISECTOMY.   The patient is healthy female who has had left knee pain since July.  She denies any specific injury to her knee, but reports that one day she was just lying on the couch and she noticed that her left knee was swollen. She reports popping and catching. She has had no improvement with over the counter medications. Her symptoms are rated as moderate to severe, and have been worsening.  This is significantly impairing activities of daily living.    Please see clinic note for further details on this patient's care.    She has elected for surgical management.   Past Medical History:  Diagnosis Date  . Mastitis    Past Surgical History:  Procedure Laterality Date  . HERNIA REPAIR     Social History   Socioeconomic History  . Marital status: Single    Spouse name: Not on file  . Number of children: Not on file  . Years of education: Not on file  . Highest education level: Not on file  Occupational History  . Not on file  Social Needs  . Financial resource strain: Not on file  . Food insecurity    Worry: Not on file    Inability: Not on file  . Transportation needs    Medical: Not on file    Non-medical: Not on file  Tobacco Use  . Smoking status: Never Smoker  . Smokeless tobacco: Never Used  Substance and Sexual Activity  . Alcohol use: Yes    Comment: occasional  . Drug use: No  . Sexual activity: Not Currently    Partners: Male    Birth control/protection: Injection  Lifestyle  . Physical activity    Days per week: Not on file    Minutes per session: Not on file  . Stress: Not on file  Relationships  . Social Herbalist on phone: Not on file    Gets together: Not on file    Attends religious service: Not on file   Active member of club or organization: Not on file    Attends meetings of clubs or organizations: Not on file    Relationship status: Not on file  Other Topics Concern  . Not on file  Social History Narrative  . Not on file   Family History  Problem Relation Age of Onset  . Hypertension Father   . Diabetes Father    No Known Allergies Prior to Admission medications   Medication Sig Start Date End Date Taking? Authorizing Provider  medroxyPROGESTERone (DEPO-PROVERA) 150 MG/ML injection Inject 150 mg into the muscle every 3 (three) months.   Yes Joline Salt, RN    ROS: All other systems have been reviewed and were otherwise negative with the exception of those mentioned in the HPI and as above.  Physical Exam: General: Alert, no acute distress Cardiovascular: No pedal edema Respiratory: No cyanosis, no use of accessory musculature GI: No organomegaly, abdomen is soft and non-tender Skin: No lesions in the area of chief complaint Neurologic: Sensation intact distally Psychiatric: Patient is competent for consent with normal mood and affect Lymphatic: No axillary or cervical lymphadenopathy  MUSCULOSKELETAL:  Left knee: Positive McMurray's. She is tender to palpation over the medial joint line, less so over the  lateral side. No effusion.   Imaging: MRI of left knee demonstrates a possible undersurface medial meniscus tear but no other obvious pathology. With the MRI with and without contrast  they were able to assess a  medial lesion to the knee consistent with an osteochondroma.  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    Plan: Plan for Procedure(s): LEFT KNEE ARTHROSCOPY WITH MEDIAL MENISECTOMY  We talked to the  patient about her options. She has mechanical symptoms and has joint line tenderness . We think she would benefit from an arthroscopy and partial meniscectomy. She understands the risks, benefits  and alternatives and would like to proceed.   The risks benefits and alternatives were discussed with the patient including but not limited to the risks of nonoperative treatment, versus surgical intervention including infection, bleeding, nerve injury,  blood clots, cardiopulmonary complications, morbidity, mortality, among others, and they were willing to proceed.   The patient acknowledged the explanation, agreed to proceed with the plan and consent was signed.   Operative Plan: Left knee arthroscopy and partial meniscectomy Discharge Medications: Standard (Tylenol, Mobic, Oxycodone, Zofran) DVT Prophylaxis: Aspirin Physical Therapy: Outpatient PT   Vernetta Honey, PA-C  09/16/2019 3:51 PM

## 2019-09-17 LAB — NOVEL CORONAVIRUS, NAA (HOSP ORDER, SEND-OUT TO REF LAB; TAT 18-24 HRS): SARS-CoV-2, NAA: NOT DETECTED

## 2019-09-19 ENCOUNTER — Encounter (HOSPITAL_BASED_OUTPATIENT_CLINIC_OR_DEPARTMENT_OTHER)
Admission: RE | Disposition: A | Discharge status: Home / Self Care | Payer: Self-pay | Source: Home / Self Care | Attending: Orthopaedic Surgery

## 2019-09-19 ENCOUNTER — Other Ambulatory Visit: Payer: Self-pay

## 2019-09-19 ENCOUNTER — Ambulatory Visit (HOSPITAL_BASED_OUTPATIENT_CLINIC_OR_DEPARTMENT_OTHER): Payer: 59 | Admitting: Certified Registered"

## 2019-09-19 ENCOUNTER — Ambulatory Visit (HOSPITAL_BASED_OUTPATIENT_CLINIC_OR_DEPARTMENT_OTHER)
Admission: RE | Admit: 2019-09-19 | Discharge: 2019-09-19 | Disposition: A | Discharge status: Home / Self Care | Payer: 59 | Attending: Orthopaedic Surgery | Admitting: Orthopaedic Surgery

## 2019-09-19 ENCOUNTER — Encounter (HOSPITAL_BASED_OUTPATIENT_CLINIC_OR_DEPARTMENT_OTHER): Payer: Self-pay | Admitting: Orthopaedic Surgery

## 2019-09-19 DIAGNOSIS — Z793 Long term (current) use of hormonal contraceptives: Secondary | ICD-10-CM | POA: Diagnosis not present

## 2019-09-19 DIAGNOSIS — S83207A Unspecified tear of unspecified meniscus, current injury, left knee, initial encounter: Secondary | ICD-10-CM | POA: Diagnosis present

## 2019-09-19 DIAGNOSIS — M2392 Unspecified internal derangement of left knee: Secondary | ICD-10-CM | POA: Insufficient documentation

## 2019-09-19 HISTORY — PX: CHONDROPLASTY: SHX5177

## 2019-09-19 HISTORY — PX: KNEE ARTHROSCOPY WITH EXCISION PLICA: SHX5647

## 2019-09-19 LAB — POCT PREGNANCY, URINE: Preg Test, Ur: NEGATIVE

## 2019-09-19 SURGERY — ARTHROSCOPY, KNEE, WITH PLICA EXCISION
Anesthesia: General | Site: Knee | Laterality: Left

## 2019-09-19 MED ORDER — BUPIVACAINE HCL (PF) 0.25 % IJ SOLN
INTRAMUSCULAR | Status: DC | PRN
  Administered 2019-09-19: 20 mL via INTRA_ARTICULAR

## 2019-09-19 MED ORDER — FENTANYL CITRATE (PF) 100 MCG/2ML IJ SOLN: 50.0000 ug | INTRAMUSCULAR | Status: DC | PRN

## 2019-09-19 MED ORDER — OXYCODONE HCL 5 MG PO TABS: ORAL_TABLET | ORAL | 0 refills | Status: AC

## 2019-09-19 MED ORDER — PROPOFOL 10 MG/ML IV BOLUS
INTRAVENOUS | Status: DC | PRN
  Administered 2019-09-19: 180 mg via INTRAVENOUS

## 2019-09-19 MED ORDER — LACTATED RINGERS IV SOLN
INTRAVENOUS | Status: DC | PRN
  Administered 2019-09-19: 09:00:00 via INTRAVENOUS

## 2019-09-19 MED ORDER — LACTATED RINGERS IV SOLN
INTRAVENOUS | Status: DC
  Administered 2019-09-19: 09:00:00 via INTRAVENOUS

## 2019-09-19 MED ORDER — ONDANSETRON HCL 4 MG/2ML IJ SOLN
INTRAMUSCULAR | Status: AC
  Filled 2019-09-19: qty 2

## 2019-09-19 MED ORDER — OXYCODONE HCL 5 MG PO TABS: 5.0000 mg | ORAL_TABLET | Freq: Once | ORAL | Status: DC | PRN

## 2019-09-19 MED ORDER — LIDOCAINE HCL (CARDIAC) PF 100 MG/5ML IV SOSY
PREFILLED_SYRINGE | INTRAVENOUS | Status: DC | PRN
  Administered 2019-09-19: 50 mg via INTRAVENOUS

## 2019-09-19 MED ORDER — ONDANSETRON HCL 4 MG/2ML IJ SOLN
INTRAMUSCULAR | Status: DC | PRN
  Administered 2019-09-19: 4 mg via INTRAVENOUS

## 2019-09-19 MED ORDER — CEFAZOLIN SODIUM-DEXTROSE 2-4 GM/100ML-% IV SOLN
INTRAVENOUS | Status: AC
  Filled 2019-09-19: qty 100

## 2019-09-19 MED ORDER — FENTANYL CITRATE (PF) 100 MCG/2ML IJ SOLN
INTRAMUSCULAR | Status: AC
  Filled 2019-09-19: qty 2

## 2019-09-19 MED ORDER — LIDOCAINE 2% (20 MG/ML) 5 ML SYRINGE
INTRAMUSCULAR | Status: AC
  Filled 2019-09-19: qty 5

## 2019-09-19 MED ORDER — KETOROLAC TROMETHAMINE 30 MG/ML IJ SOLN: 30.0000 mg | Freq: Once | INTRAMUSCULAR | Status: DC | PRN

## 2019-09-19 MED ORDER — MIDAZOLAM HCL 2 MG/2ML IJ SOLN
INTRAMUSCULAR | Status: AC
  Filled 2019-09-19: qty 2

## 2019-09-19 MED ORDER — ONDANSETRON HCL 4 MG/2ML IJ SOLN
4.0000 mg | Freq: Once | INTRAMUSCULAR | Status: AC | PRN
  Administered 2019-09-19: 4 mg via INTRAVENOUS

## 2019-09-19 MED ORDER — MELOXICAM 7.5 MG PO TABS: 7.5000 mg | ORAL_TABLET | Freq: Every day | ORAL | 2 refills | Status: AC

## 2019-09-19 MED ORDER — ONDANSETRON HCL 4 MG PO TABS: 4.0000 mg | ORAL_TABLET | Freq: Three times a day (TID) | ORAL | 1 refills | Status: AC | PRN

## 2019-09-19 MED ORDER — ASPIRIN 81 MG PO CHEW: 81.0000 mg | CHEWABLE_TABLET | Freq: Two times a day (BID) | ORAL | 1 refills | Status: AC

## 2019-09-19 MED ORDER — CHLORHEXIDINE GLUCONATE 4 % EX LIQD: 60.0000 mL | Freq: Once | CUTANEOUS | Status: DC

## 2019-09-19 MED ORDER — MIDAZOLAM HCL 2 MG/2ML IJ SOLN
1.0000 mg | INTRAMUSCULAR | Status: DC | PRN
  Administered 2019-09-19: 2 mg via INTRAVENOUS

## 2019-09-19 MED ORDER — SCOPOLAMINE 1 MG/3DAYS TD PT72
MEDICATED_PATCH | TRANSDERMAL | Status: DC | PRN
  Administered 2019-09-19: 1 mg via TRANSDERMAL

## 2019-09-19 MED ORDER — ACETAMINOPHEN 160 MG/5ML PO SOLN: 325.0000 mg | ORAL | Status: DC | PRN

## 2019-09-19 MED ORDER — DEXAMETHASONE SODIUM PHOSPHATE 10 MG/ML IJ SOLN
INTRAMUSCULAR | Status: DC | PRN
  Administered 2019-09-19: 5 mg via INTRAVENOUS

## 2019-09-19 MED ORDER — FENTANYL CITRATE (PF) 100 MCG/2ML IJ SOLN: 25.0000 ug | INTRAMUSCULAR | Status: DC | PRN

## 2019-09-19 MED ORDER — ACETAMINOPHEN 325 MG PO TABS: 325.0000 mg | ORAL_TABLET | ORAL | Status: DC | PRN

## 2019-09-19 MED ORDER — SODIUM CHLORIDE 0.9 % IR SOLN
Status: DC | PRN
  Administered 2019-09-19: 10:00:00 1500 mL

## 2019-09-19 MED ORDER — PROPOFOL 10 MG/ML IV BOLUS
INTRAVENOUS | Status: AC
  Filled 2019-09-19: qty 20

## 2019-09-19 MED ORDER — FENTANYL CITRATE (PF) 100 MCG/2ML IJ SOLN
INTRAMUSCULAR | Status: DC | PRN
  Administered 2019-09-19: 100 ug via INTRAVENOUS
  Administered 2019-09-19 (×2): 50 ug via INTRAVENOUS

## 2019-09-19 MED ORDER — MEPERIDINE HCL 25 MG/ML IJ SOLN: 6.2500 mg | INTRAMUSCULAR | Status: DC | PRN

## 2019-09-19 MED ORDER — ACETAMINOPHEN 500 MG PO TABS: 1000.0000 mg | ORAL_TABLET | Freq: Three times a day (TID) | ORAL | 0 refills | Status: AC

## 2019-09-19 MED ORDER — OXYCODONE HCL 5 MG/5ML PO SOLN: 5.0000 mg | Freq: Once | ORAL | Status: DC | PRN

## 2019-09-19 MED ORDER — CEFAZOLIN SODIUM-DEXTROSE 2-4 GM/100ML-% IV SOLN: 2.0000 g | INTRAVENOUS | Status: DC

## 2019-09-19 SURGICAL SUPPLY — 46 items
BANDAGE ESMARK 6X9 LF (GAUZE/BANDAGES/DRESSINGS) IMPLANT
BENZOIN TINCTURE PRP APPL 2/3 (GAUZE/BANDAGES/DRESSINGS) IMPLANT
BLADE CLIPPER SURG (BLADE) IMPLANT
BNDG ELASTIC 6X5.8 VLCR STR LF (GAUZE/BANDAGES/DRESSINGS) ×4 IMPLANT
BNDG ESMARK 6X9 LF (GAUZE/BANDAGES/DRESSINGS)
CHLORAPREP W/TINT 26 (MISCELLANEOUS) ×4 IMPLANT
CLOSURE STERI-STRIP 1/2X4 (GAUZE/BANDAGES/DRESSINGS) ×1
CLSR STERI-STRIP ANTIMIC 1/2X4 (GAUZE/BANDAGES/DRESSINGS) ×3 IMPLANT
CUFF TOURN SGL QUICK 34 (TOURNIQUET CUFF)
CUFF TRNQT CYL 34X4.125X (TOURNIQUET CUFF) IMPLANT
DISSECTOR 3.5MM X 13CM CVD (MISCELLANEOUS) IMPLANT
DISSECTOR 4.0MMX13CM CVD (MISCELLANEOUS) ×4 IMPLANT
DRAPE ARTHROSCOPY W/POUCH 90 (DRAPES) ×4 IMPLANT
DRAPE IMP U-DRAPE 54X76 (DRAPES) ×4 IMPLANT
DRAPE U-SHAPE 47X51 STRL (DRAPES) IMPLANT
GAUZE SPONGE 4X4 12PLY STRL (GAUZE/BANDAGES/DRESSINGS) ×4 IMPLANT
GLOVE BIO SURGEON STRL SZ 6.5 (GLOVE) ×3 IMPLANT
GLOVE BIO SURGEONS STRL SZ 6.5 (GLOVE) ×1
GLOVE BIOGEL PI IND STRL 6.5 (GLOVE) ×2 IMPLANT
GLOVE BIOGEL PI IND STRL 7.0 (GLOVE) ×4 IMPLANT
GLOVE BIOGEL PI IND STRL 8 (GLOVE) ×2 IMPLANT
GLOVE BIOGEL PI INDICATOR 6.5 (GLOVE) ×2
GLOVE BIOGEL PI INDICATOR 7.0 (GLOVE) ×4
GLOVE BIOGEL PI INDICATOR 8 (GLOVE) ×2
GLOVE ECLIPSE 6.5 STRL STRAW (GLOVE) ×4 IMPLANT
GLOVE ECLIPSE 8.0 STRL XLNG CF (GLOVE) ×8 IMPLANT
GOWN STRL REUS W/ TWL LRG LVL3 (GOWN DISPOSABLE) ×4 IMPLANT
GOWN STRL REUS W/TWL LRG LVL3 (GOWN DISPOSABLE) ×4
GOWN STRL REUS W/TWL XL LVL3 (GOWN DISPOSABLE) ×4 IMPLANT
IV NS IRRIG 3000ML ARTHROMATIC (IV SOLUTION) ×4 IMPLANT
KIT TURNOVER KIT B (KITS) ×4 IMPLANT
KNEE WRAP E Z 3 GEL PACK (MISCELLANEOUS) ×4 IMPLANT
MANIFOLD NEPTUNE II (INSTRUMENTS) IMPLANT
NDL SAFETY ECLIPSE 18X1.5 (NEEDLE) IMPLANT
NEEDLE HYPO 18GX1.5 SHARP (NEEDLE)
NS IRRIG 1000ML POUR BTL (IV SOLUTION) IMPLANT
PACK ARTHROSCOPY DSU (CUSTOM PROCEDURE TRAY) ×4 IMPLANT
PORT APPOLLO RF 90DEGREE MULTI (SURGICAL WAND) IMPLANT
SLEEVE SCD COMPRESS KNEE MED (MISCELLANEOUS) ×4 IMPLANT
SUT MNCRL AB 4-0 PS2 18 (SUTURE) ×4 IMPLANT
SYR 5ML LUER SLIP (SYRINGE) IMPLANT
TOWEL GREEN STERILE FF (TOWEL DISPOSABLE) ×4 IMPLANT
TUBE CONNECTING 20'X1/4 (TUBING)
TUBE CONNECTING 20X1/4 (TUBING) IMPLANT
TUBING ARTHROSCOPY IRRIG 16FT (MISCELLANEOUS) ×4 IMPLANT
WATER STERILE IRR 1000ML POUR (IV SOLUTION) IMPLANT

## 2019-09-19 NOTE — Anesthesia Procedure Notes (Signed)
Procedure Name: LMA Insertion Performed by: Kellyanne Ellwanger M, CRNA Pre-anesthesia Checklist: Patient identified, Emergency Drugs available, Suction available and Patient being monitored Patient Re-evaluated:Patient Re-evaluated prior to induction Oxygen Delivery Method: Circle system utilized Preoxygenation: Pre-oxygenation with 100% oxygen Induction Type: IV induction Ventilation: Mask ventilation without difficulty LMA: LMA inserted LMA Size: 3.0 Number of attempts: 1 Placement Confirmation: positive ETCO2 Tube secured with: Tape Dental Injury: Teeth and Oropharynx as per pre-operative assessment        

## 2019-09-19 NOTE — Interval H&P Note (Signed)
History and Physical Interval Note:  09/19/2019 8:56 AM  Carol Williamson  has presented today for surgery, with the diagnosis of MEDIAL MENISCUS TEAR LEFT KNEE.  The various methods of treatment have been discussed with the patient and family. After consideration of risks, benefits and other options for treatment, the patient has consented to  Procedure(s): LEFT KNEE ARTHROSCOPY WITH MEDIAL MENISECTOMY (Left) as a surgical intervention.  The patient's history has been reviewed, patient examined, no change in status, stable for surgery.  I have reviewed the patient's chart and labs.  Questions were answered to the patient's satisfaction.     Hiram Gash

## 2019-09-19 NOTE — Transfer of Care (Signed)
Immediate Anesthesia Transfer of Care Note  Patient: Carol Williamson  Procedure(s) Performed: LEFT KNEE ARTHROSCOPY WITHPLICA RESECTION AND CHONDROPLASTY (Left Knee) CHONDROPLASTY (Knee)  Patient Location: PACU  Anesthesia Type:General  Level of Consciousness: awake, alert  and oriented  Airway & Oxygen Therapy: Patient Spontanous Breathing and Patient connected to face mask oxygen  Post-op Assessment: Report given to RN and Post -op Vital signs reviewed and stable  Post vital signs: Reviewed and stable  Last Vitals:  Vitals Value Taken Time  BP 130/75 09/19/19 1007  Temp    Pulse 102 09/19/19 1008  Resp    SpO2 100 % 09/19/19 1008  Vitals shown include unvalidated device data.  Last Pain:  Vitals:   09/19/19 0840  TempSrc: Oral  PainSc: 0-No pain         Complications: No apparent anesthesia complications

## 2019-09-19 NOTE — Anesthesia Postprocedure Evaluation (Signed)
Anesthesia Post Note  Patient: Carol Williamson  Procedure(s) Performed: LEFT KNEE ARTHROSCOPY WITHPLICA RESECTION AND CHONDROPLASTY (Left Knee) CHONDROPLASTY (Knee)     Patient location during evaluation: PACU Anesthesia Type: General Level of consciousness: awake Pain management: pain level controlled Vital Signs Assessment: post-procedure vital signs reviewed and stable Respiratory status: spontaneous breathing Cardiovascular status: stable Postop Assessment: no apparent nausea or vomiting Anesthetic complications: no    Last Vitals:  Vitals:   09/19/19 1100 09/19/19 1103  BP: 128/85   Pulse: 77 79  Resp: 17 18  Temp:    SpO2: 100% 100%    Last Pain:  Vitals:   09/19/19 1100  TempSrc:   PainSc: 2    Pain Goal: Patients Stated Pain Goal: 3 (09/19/19 1025)  LLE Motor Response: Purposeful movement (09/19/19 1045) LLE Sensation: Decreased, Numbness (09/19/19 1045)            Huston Foley

## 2019-09-19 NOTE — Op Note (Addendum)
Orthopaedic Surgery Operative Note (CSN: [id number])  Carol Williamson  [birth year] Date of Surgery: 09/19/2019   Diagnoses:  Left possible medial meniscus tear and mechanical symptoms  Procedure: Left knee chondroplasty arthroscopic of medial tibial plateau Left plica excision medially   Operative Finding Exam under anesthesia: Range of motion full and symmetric to opposite knee, ligamentously stable exam with normal lachman, perhaps slightly asymmetric lateral translation of the patella for the contralateral side Suprapatellar pouch: Normal Patellofemoral Compartment: Slight lateral tracking of the patella but overall cartilage is intact Medial Compartment: Meniscus was checked 3 times and was completely intact save for some free edge tearing that was debrided.  There was a small 3 x 3 mm chondral lesion to the center of the tibial plateau that may have caused mechanical symptoms.  This was debrided back to a stable base with a shaver.  We performed a plica excision medially as this may also have caused abrasion of the anteromedial portion of the knee. Lateral Compartment: Normal Intercondylar Notch: Normal  Successful completion of the planned procedure.  Patient's meniscus appeared relatively normal however the small chondral lesion may have caused some of her pain.  This with a plica may have caused mechanical type symptoms.  Post-operative plan: The patient will be weightbearing as tolerated.  The patient will be discharged home.  DVT prophylaxis aspirin 81 mg twice a day.   Pain control with PRN pain medication preferring oral medicines.  Follow up plan will be scheduled in approximately 7 days for incision check.  Post-Op Diagnosis: Medial plica and cartilage lesion Surgeons:Primary: Hiram Gash, MD Assistants: Noemi Chapel PA-C Location: [facility] ROOM 6 Anesthesia: General with local anesthetic Antibiotics: Ancef 2g preop Tourniquet time: None Estimated Blood Loss:  Minimal Complications: None Specimens: None Implants: * No implants in log *  Indications for Surgery:   Carol Williamson is a 28 y.o. female with continued mechanical symptoms and medial knee pain with MRI findings of possible undersurface medial meniscus tear.  Benefits and risks of operative and nonoperative management were discussed prior to surgery with patient/guardian(s) and informed consent form was completed.  Specific risks including infection, need for additional surgery, continued pain, stiffness and the need for further surgery   Procedure:   The patient was identified properly. Informed consent was obtained and the surgical site was marked. The patient was taken up to suite where general anesthesia was induced. The patient was placed in the supine position with a post against the surgical leg and a nonsterile tourniquet applied. The surgical leg was then prepped and draped usual sterile fashion.  A standard surgical timeout was performed.  2 standard anterior portals were made and diagnostic arthroscopy performed. Please note the findings as noted above.  We began and findings are above.  We noted that there was a small chondral lesion of the medial tibial plateau that was debrided back to a stable base.  This did not appear full-thickness but may have caused some of her mechanical symptoms.  Meniscus looked completely intact though we did shave the free edge fraying to avoid symptoms from this.  There was a medial plica which was debrided with a shaver back and there was no obvious mechanical symptoms from this afterwards.  Incisions closed with absorbable suture.  Local anesthetic was infused.  The patient was awoken from general anesthesia and taken to the PACU in stable condition without complication.

## 2019-09-19 NOTE — Discharge Instructions (Signed)

## 2019-09-19 NOTE — Anesthesia Preprocedure Evaluation (Signed)
Anesthesia Evaluation  Patient identified by MRN, date of birth, ID band Patient awake    Reviewed: Allergy & Precautions, NPO status , Patient's Chart, lab work & pertinent test results  Airway Mallampati: I       Dental no notable dental hx. (+) Teeth Intact   Pulmonary neg pulmonary ROS,    Pulmonary exam normal breath sounds clear to auscultation       Cardiovascular negative cardio ROS Normal cardiovascular exam Rhythm:Regular Rate:Normal     Neuro/Psych negative neurological ROS  negative psych ROS   GI/Hepatic negative GI ROS, Neg liver ROS,   Endo/Other    Renal/GU negative Renal ROS  negative genitourinary   Musculoskeletal   Abdominal Normal abdominal exam  (+)   Peds  Hematology negative hematology ROS (+)   Anesthesia Other Findings   Reproductive/Obstetrics negative OB ROS                             Anesthesia Physical Anesthesia Plan  ASA: I  Anesthesia Plan: General   Post-op Pain Management:    Induction: Intravenous  PONV Risk Score and Plan: 4 or greater and Ondansetron, Dexamethasone, Scopolamine patch - Pre-op and Midazolam  Airway Management Planned: LMA  Additional Equipment: None  Intra-op Plan:   Post-operative Plan: Extubation in OR  Informed Consent: I have reviewed the patients History and Physical, chart, labs and discussed the procedure including the risks, benefits and alternatives for the proposed anesthesia with the patient or authorized representative who has indicated his/her understanding and acceptance.     Dental advisory given  Plan Discussed with: CRNA  Anesthesia Plan Comments:         Anesthesia Quick Evaluation

## 2019-09-20 ENCOUNTER — Encounter: Payer: Self-pay | Admitting: *Deleted

## 2019-12-14 ENCOUNTER — Ambulatory Visit: Payer: 59
